# Patient Record
Sex: Male | Born: 1940 | Race: White | Hispanic: No | Marital: Married | Smoking: Never smoker
Health system: Southern US, Community
[De-identification: ages and names within clinical notes are randomized; demographics above are authoritative.]

## PROBLEM LIST (undated history)

## (undated) DIAGNOSIS — E119 Type 2 diabetes mellitus without complications: Secondary | ICD-10-CM

## (undated) DIAGNOSIS — I10 Essential (primary) hypertension: Secondary | ICD-10-CM

## (undated) DIAGNOSIS — N401 Enlarged prostate with lower urinary tract symptoms: Secondary | ICD-10-CM

## (undated) DIAGNOSIS — N390 Urinary tract infection, site not specified: Secondary | ICD-10-CM

## (undated) DIAGNOSIS — M1990 Unspecified osteoarthritis, unspecified site: Secondary | ICD-10-CM

## (undated) DIAGNOSIS — K449 Diaphragmatic hernia without obstruction or gangrene: Secondary | ICD-10-CM

## (undated) DIAGNOSIS — R002 Palpitations: Secondary | ICD-10-CM

## (undated) DIAGNOSIS — K219 Gastro-esophageal reflux disease without esophagitis: Secondary | ICD-10-CM

## (undated) DIAGNOSIS — K5730 Diverticulosis of large intestine without perforation or abscess without bleeding: Secondary | ICD-10-CM

## (undated) DIAGNOSIS — N189 Chronic kidney disease, unspecified: Secondary | ICD-10-CM

## (undated) DIAGNOSIS — I2510 Atherosclerotic heart disease of native coronary artery without angina pectoris: Secondary | ICD-10-CM

## (undated) DIAGNOSIS — Z974 Presence of external hearing-aid: Secondary | ICD-10-CM

## (undated) HISTORY — PX: OTHER SURGICAL HISTORY: SHX169

## (undated) HISTORY — PX: HEMICOLECTOMY: SHX854

## (undated) HISTORY — PX: CATARACT EXTRACTION W/ INTRAOCULAR LENS  IMPLANT, BILATERAL: SHX1307

## (undated) HISTORY — PX: TONSILLECTOMY: SUR1361

## (undated) HISTORY — PX: TOTAL KNEE ARTHROPLASTY: SHX125

## (undated) MED ORDER — LOSARTAN-HYDROCHLOROTHIAZIDE 50 MG-12.5 MG TAB
ORAL_TABLET | ORAL | Status: DC
Start: ? — End: 2013-10-20

## (undated) MED ORDER — ONETOUCH ULTRASOFT LANCETS
PACK | Status: DC
Start: ? — End: 2012-03-26

## (undated) MED ORDER — ONETOUCH ULTRASOFT LANCETS
PACK | Status: DC
Start: ? — End: 2013-06-14

## (undated) MED ORDER — ONETOUCH ULTRA TEST STRIPS
ORAL_STRIP | Status: DC
Start: ? — End: 2012-03-26

## (undated) MED ORDER — SIMVASTATIN 20 MG TAB
20 mg | ORAL_TABLET | ORAL | Status: DC
Start: ? — End: 2014-02-03

## (undated) MED ORDER — ONETOUCH ULTRA TEST STRIPS
ORAL_STRIP | Status: DC
Start: ? — End: 2013-07-22

---

## 2004-09-30 ENCOUNTER — Ambulatory Visit: Payer: Self-pay

## 2008-10-10 MED ADMIN — fentanyl citrate (pf) injection 100 mcg: INTRAVENOUS | @ 15:00:00 | NDC 00409909332

## 2008-10-10 MED ADMIN — midazolam (VERSED) injection 1-10 mg: INTRAVENOUS | @ 15:00:00 | NDC 10019002837

## 2008-10-10 MED FILL — MIDAZOLAM 1 MG/ML IJ SOLN: 1 mg/mL | INTRAMUSCULAR | Qty: 10

## 2008-10-10 MED FILL — DEXTROSE 5% IN NORMAL SALINE IV: INTRAVENOUS | Qty: 1000

## 2008-10-10 MED FILL — FENTANYL CITRATE (PF) 50 MCG/ML IJ SOLN: 50 mcg/mL | INTRAMUSCULAR | Qty: 2

## 2008-10-10 NOTE — Procedures (Signed)
Colonoscopy Operative Report    Procedure Type:   Colonoscopy --diagnostic     Indications:     Screening Colonoscopy - V76.51    Pre-operative Diagnosis: see indication above    Post-operative Diagnosis:  See findings below    Operator: Thad Ranger, MD    Referring Provider: [facility]    Sedation:  Versed 3 mg IV and Fentanyl 100 mcg IV    Pre-Procedural Exam:      Airway: clear, Malimpati 2   Heart: RRR, without gallops or rubs  Lungs: clear bilaterally without wheezes, crackles, or rhonchi  Abdomen: soft, nontender, nondistended, bowel sounds present     Procedure Details:  After informed consent was obtained with all risks and benefits of procedure explained and preoperative exam completed, the patient was taken to the endoscopy suite and placed in the left lateral decubitus position.  Upon sequential sedation as per above, a digital rectal exam was performed.  The Olympus videocolonoscope CF140L was inserted in the rectum and carefully advanced to the cecum, which was identified by the ileocecal valve and appendiceal orifice.    The quality of preparation was excellent.  The colonoscope was slowly withdrawn with careful evaluation between folds. Retoflexion in the rectum was completed.     Findings/Impression: ANUS: Anal exam reveals no masses or hemorrhoids, sphincter tone is normal.   RECTUM: Rectal exam reveals no masses or hemorrhoids.   SIGMOID COLON: The mucosa is normal with good vascular pattern and without ulcers, diverticula, and polyps.   DESCENDING COLON: The mucosa is normal with good vascular pattern and without ulcers, diverticula, and polyps.   SPLENIC FLEXURE: The splenic flexure is normal.   TRANSVERSE COLON: The mucosa is normal with good vascular pattern and without ulcers, diverticula, and polyps.   HEPATIC FLEXURE: The hepatic flexure is normal.   ASCENDING COLON: The mucosa is normal with good vascular pattern and without ulcers, diverticula, and polyps.   CECUM: The appendiceal orifice  appears normal. The ileocecal valve appears normal.   TERMINAL ILEUM: The terminal ileum was not entered.       Specimen Removed:      Complications: None.     EBL:  None.    Recommendations: --For colon cancer screening in this average-risk patient, colonoscopy may be repeated in 10 years. Regular diet.  Resume normal medication(s).       Discharge Disposition:  Home in the company of a driver when able to ambulate.

## 2008-10-10 NOTE — Procedures (Signed)
Colonoscopy Operative Report    Procedure Type:   Colonoscopy --diagnostic     Indications:     Screening Colonoscopy - V76.51    Pre-operative Diagnosis: see indication above    Post-operative Diagnosis:  See findings below    Operator: Thad Ranger, MD    Referring Provider: [facility]    Sedation:  Versed 3 mg IV and Fentanyl 100 mcg IV    Pre-Procedural Exam:      Airway: clear, Malimpati 2   Heart: RRR, without gallops or rubs  Lungs: clear bilaterally without wheezes, crackles, or rhonchi  Abdomen: soft, nontender, nondistended, bowel sounds present     Procedure Details:  After informed consent was obtained with all risks and benefits of procedure explained and preoperative exam completed, the patient was taken to the endoscopy suite and placed in the left lateral decubitus position.  Upon sequential sedation as per above, a digital rectal exam was performed.  The Olympus videocolonoscope CF140L was inserted in the rectum and carefully advanced to the cecum, which was identified by the ileocecal valve and appendiceal orifice.    The quality of preparation was excellent.  The colonoscope was slowly withdrawn with careful evaluation between folds. Retoflexion in the rectum was completed.     Findings/Impression: ANUS: Anal exam reveals no masses or hemorrhoids, sphincter tone is normal.   RECTUM: Rectal exam reveals no masses or hemorrhoids.   SIGMOID COLON: The mucosa is normal with good vascular pattern and without ulcers, diverticula, and polyps.   DESCENDING COLON: The mucosa is normal with good vascular pattern and without ulcers, diverticula, and polyps.   SPLENIC FLEXURE: The splenic flexure is normal.   TRANSVERSE COLON: The mucosa is normal with good vascular pattern and without ulcers, diverticula, and polyps.   HEPATIC FLEXURE: The hepatic flexure is normal.   ASCENDING COLON: The mucosa is normal with good vascular pattern and without ulcers, diverticula, and polyps.    CECUM: The appendiceal orifice appears normal. The ileocecal valve appears normal.   TERMINAL ILEUM: The terminal ileum was not entered.       Specimen Removed:      Complications: None.     EBL:  None.    Recommendations: --For colon cancer screening in this average-risk patient, colonoscopy may be repeated in 10 years. Regular diet.  Resume normal medication(s).       Discharge Disposition:  Home in the company of a driver when able to ambulate.

## 2008-10-10 NOTE — H&P (Signed)
History and Physical    Subjective:screening       Past Medical History   Diagnosis Date   ??? Hypertension    ??? GERD (gastroesophageal reflux disease)    ??? Diabetes      diet controled   ??? Arthritis      knees   ??? Other ill-defined conditions      diverticular disease/colon        Past Surgical History   Procedure Date   ??? Abdomen surgery proc unlisted      colectomy/12 years ago   ??? Hx appendectomy    ??? Hx other surgical      tonsil       No family history on file.   History   Substance Use Topics   ??? Tobacco Use: Never   ??? Alcohol Use: Yes      social         Prior to Admission medications    Medication Sig Start Date End Date Taking? Authorizing Provider   simvastatin (ZOCOR) 20 mg tablet Take 20 mg by mouth nightly.   Yes Historical Provider   losartan (COZAAR) 50 mg tablet Take 50 mg by mouth daily.   Yes Historical Provider   omeprazole (PRILOSEC) 20 mg capsule Take 20 mg by mouth daily.   Yes Historical Provider   naproxen sodium (ALEVE) 220 mg tablet Take 220 mg by mouth two (2) times daily (with meals).   Yes Historical Provider       Allergies   Allergen Reactions   ??? Penicillins Rash          Review of Systems:  Pertinent items are noted in the History of Present Illness.     Objective:     Intake and Output:            Physical Exam:   BP 133/81   Pulse 50   Resp 16   Ht 5\' 11"  (1.803 m)   Wt 243 lb (110.224 kg)   SpO2 95%  General appearance: alert, cooperative, no distress, appears stated age  Lungs: clear to auscultation bilaterally  Heart: regular rate and rhythm, S1, S2 normal, no murmur, click, rub or gallop  Abdomen: soft, non-tender. Bowel sounds normal. No masses,  no organomegaly    Data Review:   No results found for this or any previous visit (from the past 24 hour(s)).    Assessment:     Patient Active Hospital Problem List:   * No active hospital problems. *       Plan:     colon    Signed By: Arnoldo Lenis, MD     October 10, 2008

## 2010-03-15 NOTE — Patient Instructions (Signed)
1) Check blood sugars 4 times per week.  One time should be fasting (goal is 80-130).  The other time should be 2 hours after a meal (goal is less than 180).  Alternate one meal a day to check (example: one day check after breakfast, one day after lunch, one day after dinner).  Write down what you ate if your blood sugar is more than 180 so you can know to cut back or cut out this food from your diet.    2) Please go to your local [facility] 3-4 days before your next appointment to have your labs drawn.  Please fast for your labs.  Don't eat anything after midnight.

## 2010-03-15 NOTE — Progress Notes (Signed)
Chief Complaint   Patient presents with   ??? Diabetes       History of Present Illness: Walter Molina is a 69 y.o. male who is a new patient for evaluation of diabetes.  Was diagnosed with diabetes about 10 years ago and has been controlling this with diet the whole time.  Checks blood sugars about once a month fasting and they are usually in the 125-130 range.  Most recent Hgb A1c was 7.1% in September. A typical day is as follows:  - breakfast: cereal or oatmeal, may have 1/2 of a small bagel with this, 6 oz of juice  - lunch: may have fast food: hamburger without fries, chips if he gets a sub  - dinner: cracker barrel: meat loaf or chicken with carrots and Warr beans, likes rice, not much pasta or potato, occ biscuit, not much dessert unless sugar free  - beverages: rice milk, diet soda,   - snacks: sugar free candy or piece of rice cheese  Exercise consists of walking for 30 minutes most days of the week.  No history of vascular disease.  No history of retinopathy, neuropathy, or nephropathy.  Last eye exam was June 2011.    Past Medical History   Diagnosis Date   ??? Hypertension    ??? GERD (gastroesophageal reflux disease)    ??? Arthritis      knees   ??? Other ill-defined conditions      diverticular disease/colon   ??? Other and unspecified hyperlipidemia    ??? DM w/o complication type II    ??? Seasonal allergic rhinitis        Past Surgical History   Procedure Date   ??? Abdomen surgery proc unlisted      colectomy/12 years ago   ??? Hx appendectomy    ??? Hx other surgical      tonsillectomy   ??? Colonoscopy 10/10/2008         ??? Hx orthopaedic      bunionectomy b/l       Current outpatient prescriptions   Medication Sig   ??? losartan-hydrochlorothiazide (HYZAAR) 50-12.5 mg per tablet Take 1 Tab by mouth daily.   ??? simvastatin (ZOCOR) 20 mg tablet Take 20 mg by mouth nightly.   ??? omeprazole (PRILOSEC) 20 mg capsule Take 20 mg by mouth daily.    ??? naproxen sodium (ALEVE) 220 mg tablet Take 220 mg by mouth two (2) times daily (with meals).       Allergies   Allergen Reactions   ??? Penicillins Rash       Family History   Problem Relation Age of Onset   ??? Diabetes Father    ??? Stroke Father 9   ??? Diabetes Other      cousin   ??? Heart Disease Neg Hx        History   Social History   ??? Marital Status: Married     Spouse Name: N/A     Number of Children: N/A   ??? Years of Education: N/A   Occupational History   ??? Not on file.   Social History Main Topics   ??? Smoking status: Never Smoker    ??? Smokeless tobacco: Not on file   ??? Alcohol Use: Yes      very rarely may have a beer or glass of wine   ??? Drug Use: No   ??? Sexually Active:    Other Topics Concern   ??? Not on file  Social History Narrative    Lives in [place] with wife of 9 years.  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         Review of Systems:  - Constitutional Symptoms: no fevers, chills, weight loss  - Eyes: no blurry vision or double vision  - Cardiovascular: no chest pain or palpitations  - Respiratory: no cough or shortness of breath  - Gastrointestinal: no dysphagia or abdominal pain  - Musculoskeletal: (+) left knee pain and left shoulder pain  - Integumentary: no rashes  - Neurological: no numbness, tingling, occ sinus headaches  - Psychiatric: no depression or anxiety  - Endocrine: no heat or cold intolerance, no polyuria or polydipsia, nocturia x1-2    Physical Examination:  Blood pressure 130/72, pulse 56, height 5\' 11"  (1.803 m), weight 239 lb 5 oz (108.551 kg).  - General: pleasant, no distress, good eye contact  - HEENT: no exopthalmos, no periorbital edema, no scleral/conjunctival injection, EOMI, no lid lag or stare  - Neck: supple, no thyromegaly, masses, lymph nodes, or carotid bruits, no supraclavicular or dorsocervical fat pads   - Cardiovascular: regular, normal rate, normal S1 and S2, no murmurs/rubs/gallops, 2+ dorsalis pedis pulses bilaterally  - Respiratory: clear to auscultation bilaterally  - Gastrointestinal: soft, nontender, nondistended, no masses, no hepatosplenomegaly  - Musculoskeletal: no proximal muscle weakness in upper or lower extremities  - Integumentary: no acanthosis nigricans, no abdominal striae, no rashes, no edema, no foot ulcers  - Neurological: intact sensation to monofilament 4/4 locations, decreased vibratory sensation at great toes bilaterally,   - Psychiatric: normal mood and affect    Data Reviewed: 9/11  - Hgb A1c 7.1%  - lipids: total 149 ,  HDL 35, TG 188, LDL 76  - ALT 44, AST 25  - BUN/Cr 18/0.89    Assessment/Plan:   1. 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  He has been diet controlled for about 10 years and I would like to try and continue this for as long as possible.  his biggest problem is the amount of CHO in his diet and we discussed the need for checking post-prandial blood sugars to determine where to make changes in his diet.  If he does not make progress with dietary changes then he will benefit from starting metformin in the future.  - cont diet control for now.  - check bs 4 times per week  - foot exam done 12/11  - optho UTD 6/11  - check Hgb A1c, cmp, and microalbumin prior to next visit     2. Hypertension (401.9AJ) his BP was at goal < 130/80.  - cont hyzaar 50/12.5 mg daily     3. 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  LDL 79 in September.  - cont simva 20 mg daily  - check lipids prior to next visit          Patient Instructions    1) Check blood sugars 4 times per week.  One time should be fasting (goal is 80-130).  The other time should be 2 hours after a meal (goal is less than 180).  Alternate one meal a day to check (example: one day check after breakfast, one day after lunch, one day after dinner).  Write down what you  ate if your blood sugar is more than 180 so you can know to cut back or cut out this food from your diet.    2) Please go to your local [facility] 3-4 days before your next appointment to have your labs drawn.  Please fast for your labs.  Don't eat anything after midnight.              Follow-up Disposition:  Return in about 7 weeks (around 05/03/2010).    Copy sent to:  [facility] [phone]

## 2010-04-07 LAB — URINALYSIS W/MICROSCOPIC
Bacteria: NEGATIVE /HPF
Bilirubin: NEGATIVE
Blood: NEGATIVE
Glucose: NEGATIVE MG/DL
Ketone: NEGATIVE MG/DL
Leukocyte Esterase: NEGATIVE
Nitrites: NEGATIVE
Protein: NEGATIVE MG/DL
Specific gravity: 1.02 (ref 1.003–1.030)
Specific gravity: 1.02 (ref 1.003–1.030)
Urobilinogen: 0.2 EU/DL (ref 0.2–1.0)
pH (UA): 5 (ref 5.0–8.0)

## 2010-04-07 MED ORDER — IBUPROFEN 400 MG TAB
400 mg | ORAL_TABLET | Freq: Four times a day (QID) | ORAL | Status: DC | PRN
Start: 2010-04-07 — End: 2010-05-05

## 2010-04-07 MED ORDER — HYDROCODONE-ACETAMINOPHEN 5 MG-500 MG TAB
5-500 mg | ORAL_TABLET | ORAL | Status: DC | PRN
Start: 2010-04-07 — End: 2010-05-05

## 2010-04-07 NOTE — ED Provider Notes (Signed)
HPI Comments: 69 y.o. WM presents ambulatory in ED with cc of R flank pain x 1 week. Pt reports that his pain is worse at night. Pt denies any fever, chills, n/v/d, or any other sx. He was seen at [facility] 2 days ago for same sx, negative x-rays, and d/c home with no definite dx. He denies any hx of kidney stones. Pt's pmhx is significant for HTN, and DM.     PCP: none  Tobacco: no  EtOH: yes, socially    Pt has no other complaints at this time. Written by Suezanne Cheshire, ED Scribe, as dictated by Darlys Gales, MD.       The history is provided by the patient. No language interpreter was used.        Past Medical History   Diagnosis Date   ??? Hypertension    ??? GERD (gastroesophageal reflux disease)    ??? Arthritis      knees   ??? Other ill-defined conditions      diverticular disease/colon   ??? Other and unspecified hyperlipidemia    ??? DM w/o complication type II    ??? Seasonal allergic rhinitis           Past Surgical History   Procedure Date   ??? Abdomen surgery proc unlisted      colectomy/12 years ago   ??? Hx appendectomy    ??? Hx other surgical      tonsillectomy   ??? Colonoscopy 10/10/2008         ??? Hx orthopaedic      bunionectomy b/l           Family History   Problem Relation Age of Onset   ??? Diabetes Father    ??? Stroke Father 59   ??? Diabetes Other      cousin   ??? Heart Disease Neg Hx           History   Social History   ??? Marital Status: Married     Spouse Name: N/A     Number of Children: N/A   ??? Years of Education: N/A   Occupational History   ??? Not on file.   Social History Main Topics   ??? Smoking status: Never Smoker    ??? Smokeless tobacco: Not on file   ??? Alcohol Use: Yes      very rarely may have a beer or glass of wine   ??? Drug Use: No   ??? Sexually Active:    Other Topics Concern   ??? Not on file   Social History Narrative     Lives in [place] with wife of 9 years.  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                      ALLERGIES: Penicillins      Review of Systems   Constitutional: Negative.  Negative for fever, chills, activity change, appetite change, fatigue and unexpected weight change.   HENT: Negative.  Negative for hearing loss, congestion, rhinorrhea, sneezing, neck stiffness and voice change.    Eyes: Negative.  Negative for pain and visual disturbance.   Respiratory: Negative.  Negative for apnea, cough, choking, chest tightness and shortness of breath.    Cardiovascular: Negative.  Negative for chest pain and palpitations.   Gastrointestinal: Negative.  Negative for nausea, vomiting, abdominal  pain, diarrhea, blood in stool and abdominal distention.   Genitourinary: Positive for flank pain. Negative for urgency, frequency and difficulty urinating.        No discharge   Musculoskeletal: Negative.  Negative for myalgias, back pain and arthralgias.   Skin: Negative.  Negative for color change and rash.   Neurological: Negative.  Negative for dizziness, seizures, syncope, speech difficulty, weakness, numbness and headaches.   Hematological: Negative.  Negative for adenopathy.   Psychiatric/Behavioral: Negative.  Negative for suicidal ideas, behavioral problems, dysphoric mood and agitation. The patient is not nervous/anxious.    All other systems reviewed and are negative.        Filed Vitals:    04/07/10 0830 04/07/10 0911   BP: 151/82    Pulse: 53    Temp: 97.4 ??F (36.3 ??C)    Resp: 18    Height: 5\' 11"  (1.803 m)    Weight: 241 lb 4.8 oz (109.453 kg)    SpO2: 97% 96%              Physical Exam   Nursing note and vitals reviewed.  Constitutional: Vital signs are normal. He appears well-developed and well-nourished. He is active.  Non-toxic appearance. He does not appear ill. No distress.   HENT:    Head: Normocephalic and atraumatic.   Neck: Normal range of motion. Neck supple. Carotid bruit is not present. No tracheal deviation present. No thyromegaly present.   Cardiovascular: Normal rate, regular rhythm and normal heart sounds.  Exam reveals no gallop and no friction rub.    No murmur heard.  Pulmonary/Chest: Effort normal and breath sounds normal. No stridor. No respiratory distress. He has no wheezes. He has no rales. He exhibits no tenderness.   Abdominal: Soft. He exhibits no distension and no mass. No tenderness. He has no rebound, no guarding and no CVA tenderness.   Musculoskeletal: Normal range of motion.   Neurological: He is alert.   Skin: Skin is warm, dry and intact. He is not diaphoretic. No pallor.   Psychiatric: He has a normal mood and affect. His speech is normal and behavior is normal. Judgment and thought content normal.   Written by Suezanne Cheshire, ED Scribe, as dictated by Darlys Gales, MD.      MDM    Procedures    10:05 AM  Pt reports colonoscopy 1 year ago. Pt has no other complaints at this time.   Written by Suezanne Cheshire, ED Scribe, as dictated by Darlys Gales, MD.    10:07 AM   Tacy Learn  results have been reviewed with him.  He has been counseled regarding his diagnosis.  He verbally conveys understanding and agreement of the signs, symptoms, diagnosis, treatment and prognosis and additionally agrees to follow up as recommended with Dr. Park Breed, GI. He received rx for vicodin and motrin. He also agrees with the care-plan and conveys that all of his questions have been answered.  I have also put together some discharge instructions for him that include: 1) educational information regarding their diagnosis, 2) how to care for their diagnosis at home, as well a 3) list of reasons why they would want to return to the ED prior to their follow-up appointment, should their condition change.    Written by Suezanne Cheshire, ED Scribe, as dictated by Darlys Gales, MD.

## 2010-04-07 NOTE — ED Notes (Signed)
Patient alert and orientated to person place and time. Color pink. Skin warm and dry. Respirations even and unlabored. Patient dc ambulatory in stable condition with wife.

## 2010-05-02 LAB — METABOLIC PANEL, COMPREHENSIVE
A-G Ratio: 2 (ref 1.1–2.5)
ALT (SGPT): 35 IU/L (ref 0–55)
AST (SGOT): 24 IU/L (ref 0–40)
Albumin: 4.4 g/dL (ref 3.5–4.8)
Alk. phosphatase: 65 IU/L (ref 25–160)
BUN/Creatinine ratio: 19 (ref 10–22)
BUN: 18 mg/dL (ref 8–27)
Bilirubin, total: 0.6 mg/dL (ref 0.0–1.2)
CO2: 22 mmol/L (ref 20–32)
Calcium: 9.8 mg/dL (ref 8.6–10.2)
Chloride: 102 mmol/L (ref 97–108)
Creatinine: 0.94 mg/dL (ref 0.76–1.27)
GFR est AA: 94 mL/min/{1.73_m2} (ref >59)
GFR est non-AA: 81 mL/min/{1.73_m2} (ref >59)
GLOBULIN, TOTAL: 2.2 g/dL (ref 1.5–4.5)
Glucose: 135 mg/dL — ABNORMAL HIGH (ref 65–99)
Potassium: 4.8 mmol/L (ref 3.5–5.2)
Protein, total: 6.6 g/dL (ref 6.0–8.5)
Sodium: 143 mmol/L (ref 134–144)

## 2010-05-02 LAB — HEMOGLOBIN A1C WITH EAG: Hemoglobin A1c: 6.1 % — ABNORMAL HIGH (ref 4.8–5.6)

## 2010-05-02 LAB — LIPID PANEL
Cholesterol, total: 154 mg/dL (ref 100–199)
HDL Cholesterol: 36 mg/dL — ABNORMAL LOW (ref >39)
LDL, calculated: 86 mg/dL (ref 0–99)
Triglyceride: 160 mg/dL — ABNORMAL HIGH (ref 0–149)
VLDL, calculated: 32 mg/dL (ref 5–40)

## 2010-05-02 LAB — MICROALBUMIN, UR, RAND W/ MICROALB/CREAT RATIO
Creatinine, urine random: 79.5 mg/dL (ref 22.0–328.0)
Microalb/Creat ratio (ug/mg creat.): 2 mg/g creat (ref 0.0–30.0)
Microalbumin, urine: 1.6 ug/mL (ref 0.0–17.0)

## 2010-05-05 NOTE — Patient Instructions (Addendum)
1) Your Hemoglobin A1c (3 month test of blood sugar) is excellent at 6.1%.  Keep up the good work.    2) Your LDL (bad cholesterol) is at goal under 100.    3) Your blood pressure is at goal under 130/80.    4) You won't need to fast for your labs before your next visit.

## 2010-05-05 NOTE — Progress Notes (Addendum)
Chief Complaint   Patient presents with   ??? Diabetes       History of Present Illness: Walter Molina is a 70 y.o. male here for follow up of diabetes.  Has still been walking everyday and his weight is down 2 more lbs since last visit.  All of his 2 hour post meals readings are under 140 aside from a rare reading under 180 with chinese food. Overall is feeling quite good.    Current outpatient prescriptions   Medication Sig   ??? losartan-hydrochlorothiazide (HYZAAR) 50-12.5 mg per tablet Take 1 Tab by mouth daily.   ??? simvastatin (ZOCOR) 20 mg tablet Take 20 mg by mouth nightly.   ??? omeprazole (PRILOSEC) 20 mg capsule Take 20 mg by mouth daily.   ??? naproxen sodium (ALEVE) 220 mg tablet Take 220 mg by mouth as needed.       Allergies   Allergen Reactions   ??? Penicillins Rash       Review of Systems:  - Eyes: no blurry vision or double vision  - Cardiovascular: no chest pain  - Respiratory: no shortness of breath  - Musculoskeletal: no myalgias  - Neurological: no numbness/tingling in extremities    Physical Examination:  Blood pressure 128/68, pulse 52, height 5\' 11"  (1.803 m), weight 237 lb 8 oz (107.729 kg).  - General: pleasant, no distress, good eye contact   - Neck: no carotid bruits  - Cardiovascular: regular, normal rate, nl s1 and s2, no m/r/g,    - Respiratory: clear bilaterally  - Integumentary: no edema,   - Psychiatric: normal mood and affect    Data Reviewed:   Component      Latest Ref Rng 04/30/2010 04/30/2010 04/30/2010 04/30/2010           4:47 PM  4:47 PM  4:47 PM  4:47 PM   Glucose      65 - 99 mg/dL  657 (H)     BUN      8 - 27 mg/dL  18     Creatinine      0.76 - 1.27 mg/dL  8.46     GFR est non-AA      >59 mL/min/1.73  81     GFR est AA      >59 mL/min/1.73  94     BUN/Creatinine ratio      10 - 22  19     Sodium      134 - 144 mmol/L  143     Potassium      3.5 - 5.2 mmol/L  4.8     Chloride      97 - 108 mmol/L  102     CO2      20 - 32 mmol/L  22     Calcium      8.6 - 10.2 mg/dL  9.8      Protein, total      6.0 - 8.5 g/dL  6.6     Albumin      3.5 - 4.8 g/dL  4.4     GLOBULIN, TOTAL      1.5 - 4.5 g/dL  2.2     A-G Ratio      1.1 - 2.5  2.0     Bilirubin, total      0.0 - 1.2 mg/dL  0.6     Alk. phosphatase      25 - 160 IU/L  65     AST  0 - 40 IU/L  24     ALT      0 - 55 IU/L  35     Cholesterol, total      100 - 199 mg/dL    161   Triglyceride      0 - 149 mg/dL    096 (H)   HDL Cholesterol      >39 mg/dL    36 (L)   VLDL, calculated      5 - 40 mg/dL    32   LDL, calculated      0 - 99 mg/dL    86   Creatinine,urine random      22.0 - 328.0 mg/dL   04.5    Microalbumin urine,random      0.0 - 17.0 ug/mL   1.6    Microalb/Creat ratio (ug/mg creat.)      0.0 - 30.0 mg/g creat   2.0    Hemoglobin A1C      4.8 - 5.6 % 6.1 (H)          Assessment/Plan:     1. 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  He has been diet controlled for about 10 years and I would like to try and continue this for as long as possible.  Has done well with identifying his post-meal spikes and watching his intake of these foods.  - cont diet control for now.  - check bs 4 times per week  - foot exam done 12/11  - optho UTD 6/11  - microalbumin nl 1/12  - check Hgb A1c and cmp prior to next visit     2. Hypertension (401.9AJ) his BP was at goal < 130/80.  - cont hyzaar 50/12.5 mg daily     3. 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  LDL 79 in September and 86 in January 2012  - cont simva 20 mg daily  - check lipids prior to next visit                Patient Instructions   1) Your Hemoglobin A1c (3 month test of blood sugar) is excellent at 6.1%.  Keep up the good work.    2) Your LDL (bad cholesterol) is at goal under 100.    3) Your blood pressure is at goal under 130/80.    4) You won't need to fast for your labs before your next visit.        Follow-up Disposition:   Return in about 3 months (around 08/04/2010) for non-fasting labs.    Copy sent to:  [facility] [phone]    Addendum: 08/18/10    Sent him the following message in a letter:    It appears you missed your follow up appointment on 08/06/10 and have not yet rescheduled so I wanted to forward you a copy of your results.  I would like to see you back in the next 4-6 months so please reschedule your appointment with a fasting lab a few days beforehand.      METABOLIC PANEL, BASIC       Component Value Range    Glucose 137 (*) 65 - 99 (mg/dL)    BUN 18  8 - 27 (mg/dL)    Creatinine 1.47  8.29 - 1.27 (mg/dL)    GFR est non-AA  86  >59 (mL/min/1.73)    GFR est AA 100  >59 (mL/min/1.73)    BUN/Creatinine ratio 20  10 - 22     Sodium 140  134 - 144 (mmol/L)    Potassium 4.8  3.5 - 5.2 (mmol/L)    Chloride 102  97 - 108 (mmol/L)    CO2 25  20 - 32 (mmol/L)    Calcium 9.6  8.6 - 10.2 (mg/dL)   HEMOGLOBIN Z6X       Component Value Range    Hemoglobin A1C 6.4 (*) 4.8 - 5.6 (%)       Hemoglobin A1c is a 3 month marker of your diabetes control.  Goal is less than 7% which means your average blood sugar is less than 150. Your Hemoglobin A1c is 6.4% which means your diabetes is under excellent control.  Continue to work on your diet and exercise     BUN and creatinine are markers of kidney function.  Your values are 18 and 0.88 which are normal.

## 2010-08-03 LAB — HEMOGLOBIN A1C WITH EAG: Hemoglobin A1c: 6.4 % — ABNORMAL HIGH (ref 4.8–5.6)

## 2010-08-03 LAB — METABOLIC PANEL, BASIC
BUN/Creatinine ratio: 20 (ref 10–22)
BUN: 18 mg/dL (ref 8–27)
CO2: 25 mmol/L (ref 20–32)
Calcium: 9.6 mg/dL (ref 8.6–10.2)
Chloride: 102 mmol/L (ref 97–108)
Creatinine: 0.88 mg/dL (ref 0.76–1.27)
GFR est AA: 100 mL/min/{1.73_m2} (ref >59)
GFR est non-AA: 86 mL/min/{1.73_m2} (ref >59)
Glucose: 137 mg/dL — ABNORMAL HIGH (ref 65–99)
Potassium: 4.8 mmol/L (ref 3.5–5.2)
Sodium: 140 mmol/L (ref 134–144)

## 2010-08-06 NOTE — Progress Notes (Signed)
A user error has taken place: encounter opened in error, closed for administrative reasons.

## 2010-08-30 NOTE — Patient Instructions (Signed)
1) Your diabetes is still very well controlled on diet alone.  Keep up the good work.    2) Your blood pressure is excellent.    3) Please fast for your labs.  Don't eat anything after midnight.

## 2010-08-30 NOTE — Progress Notes (Signed)
Chief Complaint   Patient presents with   ??? Diabetes     History of Present Illness: Walter Molina is a 70 y.o. male here for follow up of diabetes.  Fasting sugars are in the 130s and up to 150 after meals.  Walking 30 minutes a day most days a week.  Has lost another 1 lb since last visit.    Current Outpatient Prescriptions   Medication Sig   ??? losartan-hydrochlorothiazide (HYZAAR) 50-12.5 mg per tablet Take 1 Tab by mouth daily.   ??? simvastatin (ZOCOR) 20 mg tablet Take 20 mg by mouth nightly.   ??? omeprazole (PRILOSEC) 20 mg capsule Take 20 mg by mouth daily.   ??? naproxen sodium (ALEVE) 220 mg tablet Take 220 mg by mouth as needed.     Allergies   Allergen Reactions   ??? Penicillins Rash     Review of Systems:  - Eyes: no blurry vision or double vision  - Cardiovascular: no chest pain  - Respiratory: no shortness of breath  - Musculoskeletal: no myalgias  - Neurological: no numbness/tingling in extremities    Physical Examination:  - Blood pressure 120/55, pulse 48, height 5\' 11"  (1.803 m), weight 236 lb 11.2 oz (107.366 kg).  - General: pleasant, no distress, good eye contact   - Neck: no carotid bruits  - Cardiovascular: regular, normal rate, nl s1 and s2, no m/r/g,    - Respiratory: clear bilaterally  - Integumentary: no edema,   - Psychiatric: normal mood and affect    Data Reviewed:   Component      Latest Ref Rng 08/02/2010 08/02/2010           5:34 PM  5:34 PM   Glucose      65 - 99 mg/dL  161 (H)   BUN      8 - 27 mg/dL  18   Creatinine      0.96 - 1.27 mg/dL  0.45   GFR est non-AA      >59 mL/min/1.73  86   GFR est AA      >59 mL/min/1.73  100   BUN/Creatinine ratio      10 - 22  20   Sodium      134 - 144 mmol/L  140   Potassium      3.5 - 5.2 mmol/L  4.8   Chloride      97 - 108 mmol/L  102   CO2      20 - 32 mmol/L  25   Calcium      8.6 - 10.2 mg/dL  9.6   Hemoglobin W0J      4.8 - 5.6 % 6.4 (H)        Assessment/Plan:      1. 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  He has been diet controlled for about 10 years and I would like to try and continue this for as long as possible.  Has done well with identifying his post-meal spikes and watching his intake of these foods.  - cont diet control for now.  - check bs 4 times per week  - foot exam done 12/11  - optho UTD 6/11  - microalbumin nl 1/12  - check Hgb A1c and cmp prior to next visit     2. Hypertension (401.9AJ) his BP was at goal < 130/80.  -  cont hyzaar 50/12.5 mg daily     3. 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  LDL 79 in September and 86 in January 2012  - cont simva 20 mg daily  - check lipids prior to next visit            Patient Instructions   1) Your diabetes is still very well controlled on diet alone.  Keep up the good work.    2) Your blood pressure is excellent.    3) Please fast for your labs.  Don't eat anything after midnight.        Follow-up Disposition:  Return in about 4 months (around 12/31/2010) for fasting labs.    Copy sent to:  [facility] [phone]

## 2010-09-05 NOTE — ED Provider Notes (Signed)
HPI Comments: This is a 69yo WM that presents ambulatory to ED reporting that he accidentally stabbed himself in the L forearm with metal shop scissors one hour ago (4/10).  Pt denies any other injuries and states that his last tetanus shot was three years ago. He denies any FC, NVD, abdominal pain. Patient does not take any blood thinners or ASA.     Allergy: PCN  Hx: HTN, DM, diverticulosis.   PCP: [facility]  There are no other complaints, changes or physical findings at this time.   Written by Loyal Jacobson, ED Scribe, as dictated by Delena Bali, MD    The history is provided by the patient.        Past Medical History   Diagnosis Date   ??? Hypertension    ??? GERD (gastroesophageal reflux disease)    ??? Arthritis      knees   ??? Other ill-defined conditions      diverticular disease/colon   ??? Other and unspecified hyperlipidemia    ??? Type II or unspecified type diabetes mellitus without mention of complication, not stated as uncontrolled    ??? Seasonal allergic rhinitis         Past Surgical History   Procedure Date   ??? Abdomen surgery proc unlisted      colectomy/12 years ago   ??? Hx appendectomy    ??? Hx other surgical      tonsillectomy   ??? Colonoscopy 10/10/2008         ??? Hx orthopaedic      bunionectomy b/l         Family History   Problem Relation Age of Onset   ??? Diabetes Father    ??? Stroke Father 32   ??? Diabetes Other      cousin   ??? Heart Disease Neg Hx         History     Social History   ??? Marital Status: Married     Spouse Name: N/A     Number of Children: N/A   ??? Years of Education: N/A     Occupational History   ??? Not on file.     Social History Main Topics   ??? Smoking status: Never Smoker    ??? Smokeless tobacco: Not on file   ??? Alcohol Use: Yes      very rarely may have a beer or glass of wine   ??? Drug Use: No   ??? Sexually Active: Not on file     Other Topics Concern   ??? Not on file     Social History Narrative     Lives in [place] with wife of 9 years.  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              ALLERGIES: Penicillins      Review of Systems   Constitutional: Negative.  Negative for fever and chills.   HENT: Negative.  Negative for sore throat.    Eyes: Negative.    Respiratory: Negative.  Negative for shortness of breath.    Cardiovascular: Negative.  Negative for chest pain.   Gastrointestinal: Negative.  Negative for nausea, vomiting, abdominal pain and diarrhea.   Genitourinary: Negative.  Negative for dysuria.   Musculoskeletal: Negative.  Negative for back pain.   Skin: Positive for wound.        See  HPI   Neurological: Negative.  Negative for light-headedness and headaches.   All other systems reviewed and are negative.        Filed Vitals:    09/05/10 1944   BP: 150/74   Pulse: 56   Temp: 98.2 ??F (36.8 ??C)   Resp: 16   Height: 5\' 11"  (1.803 m)   Weight: 107.1 kg (236 lb 1.8 oz)   SpO2: 95%            Physical Exam   Patient in NAD, calm and pleasant.   Skin: Shallow and linear 5mm puncture wound to the palmar aspect of the mid L forearm. Minimal venous oozing. 2+ radial and ulnar pulses. Brisk CR and motor and sensory grossly intact.    Written by Loyal Jacobson, ED Scribe, as dictated by Delena Bali, MD    MDM     Amount and/or Complexity of Data Reviewed:    Review and summarize past medical records:  Yes  Progress:   Patient progress:  Stable      Procedures    Procedure Note - Wound dressing:  8:17 PM  Procedure by Delena Bali, MD  Complexity: Simple  Wound cleansed with chlorhexidine. Applied multiple steri strips to wound with good hemostasis.  Due to puncture wound, I do not feel sutures are indicated. Procedure took 1-15 minutes. Patient tolerated procedure well.   Written by Loyal Jacobson, ED Scribe, as dictated by Delena Bali, MD      Lab Results:     No results found for this or any previous visit (from the past 12 hour(s)).    DIscharge Rx:    New prescriptions   Medication Sig Dispense Refill   ??? losartan-hydrochlorothiazide (HYZAAR) 50-12.5 mg per tablet Take 1 Tab by mouth daily.             Diagnosis:  1. Puncture wound of left forearm            Plan/DIscharge note:      8:57 PM  Tacy Learn  results have been reviewed with him.  He has been counseled regarding his diagnosis.  He verbally conveys understanding and agreement of the signs, symptoms, diagnosis, treatment and prognosis and additionally agrees to follow up as recommended with Dr. Not On File in 24 - 48 hours.  He also agrees with the care-plan and conveys that all of his questions have been answered.  I have also put together some discharge instructions for him that include: 1) educational information regarding their diagnosis, 2) how to care for their diagnosis at home, as well a 3) list of reasons why they would want to return to the ED prior to their follow-up appointment, should their condition change.   Pt is ready to go home.   Durward Mallard, MD

## 2010-09-05 NOTE — ED Notes (Signed)
Discharge instructions reviewed with pt by Dr. Hetty Ely. pt able to return/verbalize discharge instructions. Copy of discharge instructions given.  Patient condition stable, respiratory status within normal limits, neuro status intact. Pt ambulatory out of er, accompanied by wife

## 2010-09-05 NOTE — ED Notes (Signed)
Wound cleaned with chlorahexadine and steri-strips placed by Dr. Hetty Ely

## 2011-01-01 LAB — METABOLIC PANEL, COMPREHENSIVE
A-G Ratio: 2.2 (ref 1.1–2.5)
ALT (SGPT): 38 IU/L (ref 0–55)
AST (SGOT): 25 IU/L (ref 0–40)
Albumin: 4.3 g/dL (ref 3.5–4.8)
Alk. phosphatase: 68 IU/L (ref 25–160)
BUN/Creatinine ratio: 20 (ref 10–22)
BUN: 19 mg/dL (ref 8–27)
Bilirubin, total: 0.5 mg/dL (ref 0.0–1.2)
CO2: 24 mmol/L (ref 20–32)
Calcium: 9.3 mg/dL (ref 8.6–10.2)
Chloride: 102 mmol/L (ref 97–108)
Creatinine: 0.93 mg/dL (ref 0.76–1.27)
GFR est non-AA: 82 mL/min/{1.73_m2} (ref >59)
GLOBULIN, TOTAL: 2 g/dL (ref 1.5–4.5)
Glucose: 118 mg/dL — ABNORMAL HIGH (ref 65–99)
Potassium: 4.6 mmol/L (ref 3.5–5.2)
Protein, total: 6.3 g/dL (ref 6.0–8.5)
Sodium: 141 mmol/L (ref 134–144)
eGFR If African American: 95 mL/min/{1.73_m2} (ref >59)

## 2011-01-01 LAB — LIPID PANEL
Cholesterol, total: 146 mg/dL (ref 100–199)
HDL Cholesterol: 33 mg/dL — ABNORMAL LOW (ref >39)
LDL, calculated: 83 mg/dL (ref 0–99)
Triglyceride: 150 mg/dL — ABNORMAL HIGH (ref 0–149)
VLDL, calculated: 30 mg/dL (ref 5–40)

## 2011-01-01 LAB — HEMOGLOBIN A1C WITH EAG: Hemoglobin A1c: 6.1 % — ABNORMAL HIGH (ref 4.8–5.6)

## 2011-01-03 NOTE — Patient Instructions (Signed)
1) Your Hemoglobin A1c (3 month test of blood sugar) is still excellent at 6.1% down from 6.4% in April.  Please keep on file until pt calls to pick this up    2) Your blood pressure and cholesterol are still at goal.    3) You won't need to fast for your labs before your next visit.

## 2011-01-03 NOTE — Progress Notes (Signed)
Chief Complaint   Patient presents with   ??? Diabetes     History of Present Illness: Walter Molina is a 70 y.o. male here for follow up of diabetes.  Has been battling elbow inflammation and is on diclofenac for this.  Fasting sugars are in the 130-140s and doesn't tend to check at other times of day.  Saw one reading of 180 but no other readings that high.  Has continued to travel a lot.    Current Outpatient Prescriptions   Medication Sig   ??? diclofenac EC (VOLTAREN) 75 mg EC tablet Take  by mouth two (2) times a day.     ??? losartan-hydrochlorothiazide (HYZAAR) 50-12.5 mg per tablet Take 1 Tab by mouth daily.   ??? simvastatin (ZOCOR) 20 mg tablet Take 20 mg by mouth nightly.   ??? omeprazole (PRILOSEC) 20 mg capsule Take 20 mg by mouth daily.   ??? naproxen sodium (ALEVE) 220 mg tablet Take 220 mg by mouth as needed.     Allergies   Allergen Reactions   ??? Penicillins Rash     Review of Systems:  - Eyes: no blurry vision or double vision  - Cardiovascular: no chest pain  - Respiratory: no shortness of breath  - Musculoskeletal: no myalgias  - Neurological: no numbness/tingling in extremities    Physical Examination:  Blood pressure 124/72, pulse 56, height 5\' 11"  (1.803 m), weight 234 lb 12.8 oz (106.505 kg).  - General: pleasant, no distress, good eye contact   - Neck: no carotid bruits  - Cardiovascular: regular, normal rate, nl s1 and s2, no m/r/g,    - Respiratory: clear bilaterally  - Integumentary: no edema,   - Psychiatric: normal mood and affect    Data Reviewed:   Component      Latest Ref Rng 12/31/2010 12/31/2010 12/31/2010           3:58 PM  3:58 PM  3:58 PM   Glucose      65 - 99 mg/dL  191 (H)    BUN      8 - 27 mg/dL  19    Creatinine      0.76 - 1.27 mg/dL  4.78    GFR est non-AA      >59 mL/min/1.73  82    eGFR If Africn Am      >59 mL/min/1.73  95    BUN/Creatinine ratio      10 - 22  20    Sodium      134 - 144 mmol/L  141    Potassium      3.5 - 5.2 mmol/L  4.6    Chloride      97 - 108 mmol/L  102     CO2      20 - 32 mmol/L  24    Calcium      8.6 - 10.2 mg/dL  9.3    Protein, total      6.0 - 8.5 g/dL  6.3    Albumin      3.5 - 4.8 g/dL  4.3    GLOBULIN, TOTAL      1.5 - 4.5 g/dL  2.0    A-G Ratio      1.1 - 2.5  2.2    Bilirubin, total      0.0 - 1.2 mg/dL  0.5    Alk. phosphatase      25 - 160 IU/L  68    AST  0 - 40 IU/L  25    ALT      0 - 55 IU/L  38    Cholesterol, total      100 - 199 mg/dL   132   Triglyceride      0 - 149 mg/dL   440 (H)   HDL Cholesterol      >39 mg/dL   33 (L)   VLDL, calculated      5 - 40 mg/dL   30   LDL, calculated      0 - 99 mg/dL   83   Hemoglobin N0U      4.8 - 5.6 % 6.1 (H)         Assessment/Plan:     1. 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  He has been diet controlled for about 10 years and I would like to try and continue this for as long as possible.  Has done well with identifying his post-meal spikes and watching his intake of these foods.  - cont diet control for now.  - check bs 4 times per week  - foot exam done 12/11  - optho UTD 7/12  - microalbumin nl 1/12  - check Hgb A1c and bmp and microalbumin prior to next visit     2. Hypertension (401.9AJ) his BP was at goal < 130/80.  - cont hyzaar 50/12.5 mg daily     3. 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  LDL 79 in September 2011 and 86 in January 2012 and 83 in 9/12  - cont simva 20 mg daily  - check lipids in 8 months            Patient Instructions   1) Your Hemoglobin A1c (3 month test of blood sugar) is still excellent at 6.1% down from 6.4% in April.  Please keep on file until pt calls to pick this up    2) Your blood pressure and cholesterol are still at goal.    3) You won't need to fast for your labs before your next visit.            Follow-up Disposition:   Return in about 4 months (around 05/05/2011) for non-fasting labs.    Copy sent to:  [facility] [phone]

## 2011-04-30 LAB — METABOLIC PANEL, BASIC
BUN/Creatinine ratio: 24 — ABNORMAL HIGH (ref 10–22)
BUN: 21 mg/dL (ref 8–27)
CO2: 24 mmol/L (ref 20–32)
Calcium: 9.4 mg/dL (ref 8.6–10.2)
Chloride: 103 mmol/L (ref 97–108)
Creatinine: 0.87 mg/dL (ref 0.76–1.27)
GFR est non-AA: 86 mL/min/{1.73_m2} (ref >59)
Glucose: 223 mg/dL — ABNORMAL HIGH (ref 65–99)
Potassium: 4.5 mmol/L (ref 3.5–5.2)
Sodium: 141 mmol/L (ref 134–144)
eGFR If African American: 100 mL/min/{1.73_m2} (ref >59)

## 2011-04-30 LAB — MICROALBUMIN, UR, RAND W/ MICROALB/CREAT RATIO
Creatinine, urine random: 58.6 mg/dL (ref 22.0–328.0)
Microalb/Creat ratio (ug/mg creat.): 4.1 mg/g creat (ref 0.0–30.0)
Microalbumin, urine: 2.4 ug/mL (ref 0.0–17.0)

## 2011-04-30 LAB — HEMOGLOBIN A1C WITH EAG: Hemoglobin A1c: 6.4 % — ABNORMAL HIGH (ref 4.8–5.6)

## 2011-05-02 NOTE — Patient Instructions (Signed)
1) Your Hemoglobin A1c (3 month test of blood sugar) is up slightly from 6.1% to 6.4% but is still at goal under 7% so we will continue to follow this for now.    2) Your blood pressure is up today but this may be from the steroid effect so we will repeat this at the next visit.    3) Please fast for your labs.  Don't eat anything after midnight.

## 2011-05-02 NOTE — Progress Notes (Signed)
Chief Complaint   Patient presents with   ??? Diabetes     pharmacy confirmed and [facility] is [facility]     History of Present Illness: Walter Molina is a 71 y.o. male here for follow up of diabetes.  He has done well since last visit aside from some left knee pain and just had cortisone shot last Thursday and this hasn't helped too much with the pain so far.  Didn't check his sugar after the shot to see if this caused it to spike but his labs were drawn the day after the shot.  Weight is stable.  Checks sugars about once a week.  Did see a reading up to 168 in the morning during the holidays.  Aside from that his sugars have been under 150 for the most part.    Current Outpatient Prescriptions   Medication Sig   ??? losartan-hydrochlorothiazide (HYZAAR) 50-12.5 mg per tablet Take 1 Tab by mouth daily.   ??? simvastatin (ZOCOR) 20 mg tablet Take 20 mg by mouth nightly.   ??? omeprazole (PRILOSEC) 20 mg capsule Take 20 mg by mouth daily.   ??? naproxen sodium (ALEVE) 220 mg tablet Take 220 mg by mouth as needed.     Allergies   Allergen Reactions   ??? Penicillins Rash     Review of Systems:  - Eyes: no blurry vision or double vision  - Cardiovascular: no chest pain  - Respiratory: no shortness of breath  - Musculoskeletal: no myalgias, (+) left knee pain  - Neurological: no numbness/tingling in extremities    Physical Examination:  - Blood pressure 147/76, pulse 48, height 5\' 11"  (1.803 m), weight 234 lb 4.8 oz (106.278 kg).  - General: pleasant, no distress, good eye contact   - Neck: no carotid bruits  - Cardiovascular: regular, normal rate, nl s1 and s2, no m/r/g, 2+ DP pulses   - Respiratory: clear bilaterally  - Integumentary: no edema, no foot ulcers   - Neurological: decreased sensation to monofilament 4/4 locations, decreased vibratory sensation at great toes bilaterally,   - Psychiatric: normal mood and affect    Data Reviewed:   Component      Latest Ref Rng 04/29/2011 04/29/2011 04/29/2011           5:28 PM  5:28 PM  5:28  PM   Glucose      65 - 99 mg/dL  161 (H)    BUN      8 - 27 mg/dL  21    Creatinine      0.76 - 1.27 mg/dL  0.96    GFR est non-AA      >59 mL/min/1.73  86    eGFR If Africn Am      >59 mL/min/1.73  100    BUN/Creatinine ratio      10 - 22  24 (H)    Sodium      134 - 144 mmol/L  141    Potassium      3.5 - 5.2 mmol/L  4.5    Chloride      97 - 108 mmol/L  103    CO2      20 - 32 mmol/L  24    Calcium      8.6 - 10.2 mg/dL  9.4    Creatinine,urine random      22.0 - 328.0 mg/dL   04.5   Microalbumin, urine      0.0 - 17.0 ug/mL   2.4  Microalb/Creat ratio (ug/mg creat.)      0.0 - 30.0 mg/g creat   4.1   Hemoglobin A1c      4.8 - 5.6 % 6.4 (H)         Assessment/Plan:     1. 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  He has been diet controlled for about 10 years and I would like to try and continue this for as long as possible.  Has done well with identifying his post-meal spikes and watching his intake of these foods.  - cont diet control for now.  - check bs 4 times per week  - foot exam done 1/13  - optho UTD 7/12  - microalbumin nl 1/13  - check Hgb A1c and cmp prior to next visit     2. Hypertension (401.9AJ) his BP was above goal < 130/80 but may have been up due to knee pain and steroid injection so will repeat at next visit.  - cont hyzaar 50/12.5 mg daily     3. 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  LDL 79 in September 2011 and 86 in January 2012 and 83 in 9/12  - cont simva 20 mg daily  - check lipids prior to next visit         Patient Instructions   1) Your Hemoglobin A1c (3 month test of blood sugar) is up slightly from 6.1% to 6.4% but is still at goal under 7% so we will continue to follow this for now.    2) Your blood pressure is up today but this may be from the steroid effect so we  will repeat this at the next visit.    3) Please fast for your labs.  Don't eat anything after midnight.            Follow-up Disposition:  Return in about 4 months (around 08/30/2011) for fasting labs.    Copy sent to:  [facility] [phone]

## 2011-10-13 LAB — LIPID PANEL
Cholesterol, total: 158 mg/dL (ref 100–199)
HDL Cholesterol: 37 mg/dL — ABNORMAL LOW (ref >39)
LDL, calculated: 85 mg/dL (ref 0–99)
Triglyceride: 180 mg/dL — ABNORMAL HIGH (ref 0–149)
VLDL, calculated: 36 mg/dL (ref 5–40)

## 2011-10-13 LAB — METABOLIC PANEL, COMPREHENSIVE
A-G Ratio: 1.9 (ref 1.1–2.5)
ALT (SGPT): 31 IU/L (ref 0–44)
AST (SGOT): 22 IU/L (ref 0–40)
Albumin: 4.4 g/dL (ref 3.5–4.8)
Alk. phosphatase: 66 IU/L (ref 25–160)
BUN/Creatinine ratio: 16 (ref 10–22)
BUN: 14 mg/dL (ref 8–27)
Bilirubin, total: 0.6 mg/dL (ref 0.0–1.2)
CO2: 18 mmol/L — ABNORMAL LOW (ref 19–28)
Calcium: 9.4 mg/dL (ref 8.6–10.2)
Chloride: 104 mmol/L (ref 97–108)
Creatinine: 0.87 mg/dL (ref 0.76–1.27)
GFR est non-AA: 86 mL/min/{1.73_m2} (ref >59)
GLOBULIN, TOTAL: 2.3 g/dL (ref 1.5–4.5)
Glucose: 156 mg/dL — ABNORMAL HIGH (ref 65–99)
Potassium: 4 mmol/L (ref 3.5–5.2)
Protein, total: 6.7 g/dL (ref 6.0–8.5)
Sodium: 139 mmol/L (ref 134–144)
eGFR If African American: 100 mL/min/{1.73_m2} (ref >59)

## 2011-10-13 LAB — HEMOGLOBIN A1C WITH EAG: Hemoglobin A1c: 6.9 % — ABNORMAL HIGH (ref 4.8–5.6)

## 2011-10-19 MED ORDER — SIMVASTATIN 20 MG TAB
20 mg | ORAL_TABLET | Freq: Every evening | ORAL | Status: DC
Start: 2011-10-19 — End: 2013-02-09

## 2011-10-19 MED ORDER — LOSARTAN-HYDROCHLOROTHIAZIDE 50 MG-12.5 MG TAB
ORAL_TABLET | Freq: Every day | ORAL | Status: DC
Start: 2011-10-19 — End: 2012-10-20

## 2011-10-19 NOTE — Progress Notes (Signed)
Chief Complaint   Patient presents with   ??? Diabetes     no pcp and pharmacy confirmed     History of Present Illness: Walter Molina is a 71 y.o. male here for follow up of diabetes.  Is currently on some sort of antibiotic for 2 more days for a tooth infection that led to a root canal.  Has not had any further steroid injections but is still having knee pain.  Weight is down 1.5 lb.  Fasting sugars are a little higher in the 130-150 range.  Hasn't checked at any other times of day.  Compliant with simvastatin and hyzaar.   Has been on the road more over the past 6 months so he's eating more fast food and not walking as much.      Current Outpatient Prescriptions   Medication Sig   ??? losartan-hydrochlorothiazide (HYZAAR) 50-12.5 mg per tablet Take 1 Tab by mouth daily.   ??? simvastatin (ZOCOR) 20 mg tablet Take 20 mg by mouth nightly.   ??? omeprazole (PRILOSEC) 20 mg capsule Take 20 mg by mouth daily.   ??? naproxen sodium (ALEVE) 220 mg tablet Take 220 mg by mouth as needed.     Allergies   Allergen Reactions   ??? Penicillins Rash     Review of Systems:  - Eyes: no blurry vision or double vision  - Cardiovascular: no chest pain  - Respiratory: no shortness of breath  - Musculoskeletal: no myalgias  - Neurological: no numbness/tingling in extremities    Physical Examination:  Blood pressure 116/70, pulse 52, height 5\' 11"  (1.803 m), weight 232 lb 12.8 oz (105.597 kg).  - General: pleasant, no distress, good eye contact   - Neck: no carotid bruits  - Cardiovascular: regular, normal rate, nl s1 and s2, no m/r/g, 2+ DP pulses   - Respiratory: clear bilaterally  - Integumentary: no edema, no foot ulcers  - Psychiatric: normal mood and affect    Data Reviewed:   Component      Latest Ref Rng 10/12/2011 10/12/2011 10/12/2011           9:22 AM  9:22 AM  9:22 AM   Glucose      65 - 99 mg/dL 409 (H)     BUN      8 - 27 mg/dL 14     Creatinine      0.76 - 1.27 mg/dL 8.11     GFR est non-AA      >59 mL/min/1.73 86     eGFR If Africn Am       >59 mL/min/1.73 100     BUN/Creatinine ratio      10 - 22 16     Sodium      134 - 144 mmol/L 139     Potassium      3.5 - 5.2 mmol/L 4.0     Chloride      97 - 108 mmol/L 104     CO2      19 - 28 mmol/L 18 (L)     Calcium      8.6 - 10.2 mg/dL 9.4     Protein, total      6.0 - 8.5 g/dL 6.7     Albumin      3.5 - 4.8 g/dL 4.4     GLOBULIN, TOTAL      1.5 - 4.5 g/dL 2.3     A-G Ratio      1.1 - 2.5 1.9  Bilirubin, total      0.0 - 1.2 mg/dL 0.6     Alk. phosphatase      25 - 160 IU/L 66     AST      0 - 40 IU/L 22     ALT      0 - 44 IU/L 31     Cholesterol, total      100 - 199 mg/dL   161   Triglyceride      0 - 149 mg/dL   096 (H)   HDL Cholesterol      >39 mg/dL   37 (L)   VLDL, calculated      5 - 40 mg/dL   36   LDL, calculated      0 - 99 mg/dL   85   Hemoglobin E4V      4.8 - 5.6 %  6.9 (H)        Assessment/Plan:     1. 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  He has been diet controlled for about 10 years and I would like to try and continue this for as long as possible.  Has done well with identifying his post-meal spikes and watching his intake of these foods.  A1c is up due to less exercise and eating out more  - cont diet control for now.  - check bs 4 times per week  - foot exam done 1/13  - optho UTD 7/12--due now  - microalbumin nl 1/13  - check Hgb A1c and cmp prior to next visit     2. Hypertension (401.9AJ) his BP was at goal < 130/80  - cont hyzaar 50/12.5 mg daily     3. 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  LDL 79 in September 2011 and 86 in January 2012 and 83 in 9/12 and 85 in 7/13.  - cont simva 20 mg daily  - check lipids prior to next visit         Patient Instructions   1) Your Hemoglobin A1c (3 month test of blood sugar) is up to 6.9% which is the  highest it's been but is still at goal under 7% so we will continue with diet and exercise for now.    2) Your triglycerides (short term fats) are slightly higher due to diet so these should come down with watching your diet.    3) Please go to your local [facility] 3-4 days before your next appointment to have your labs drawn.  Please fast for your labs.  Don't eat anything after midnight.          Follow-up Disposition:  Return in about 5 months (around 03/20/2012).    Copy sent to:  [facility] [phone]

## 2011-10-19 NOTE — Patient Instructions (Addendum)
1) Your Hemoglobin A1c (3 month test of blood sugar) is up to 6.9% which is the highest it's been but is still at goal under 7% so we will continue with diet and exercise for now.    2) Your triglycerides (short term fats) are slightly higher due to diet so these should come down with watching your diet.    3) Please go to your local [facility] 3-4 days before your next appointment to have your labs drawn.  Please fast for your labs.  Don't eat anything after midnight.

## 2012-03-17 LAB — METABOLIC PANEL, COMPREHENSIVE
A-G Ratio: 2 (ref 1.1–2.5)
ALT (SGPT): 30 IU/L (ref 0–44)
AST (SGOT): 20 IU/L (ref 0–40)
Albumin: 4.3 g/dL (ref 3.5–4.8)
Alk. phosphatase: 62 IU/L (ref 39–117)
BUN/Creatinine ratio: 18 (ref 10–22)
BUN: 18 mg/dL (ref 8–27)
Bilirubin, total: 0.6 mg/dL (ref 0.0–1.2)
CO2: 28 mmol/L (ref 19–28)
Calcium: 9.3 mg/dL (ref 8.6–10.2)
Chloride: 105 mmol/L (ref 97–108)
Creatinine: 1.02 mg/dL (ref 0.76–1.27)
GFR est AA: 84 mL/min/{1.73_m2} (ref >59)
GFR est non-AA: 73 mL/min/{1.73_m2} (ref >59)
GLOBULIN, TOTAL: 2.1 g/dL (ref 1.5–4.5)
Glucose: 173 mg/dL — ABNORMAL HIGH (ref 65–99)
Potassium: 4.6 mmol/L (ref 3.5–5.2)
Protein, total: 6.4 g/dL (ref 6.0–8.5)
Sodium: 144 mmol/L (ref 134–144)

## 2012-03-17 LAB — LIPID PANEL
Cholesterol, total: 146 mg/dL (ref 100–199)
HDL Cholesterol: 39 mg/dL — ABNORMAL LOW (ref >39)
LDL, calculated: 84 mg/dL (ref 0–99)
Triglyceride: 113 mg/dL (ref 0–149)
VLDL, calculated: 23 mg/dL (ref 5–40)

## 2012-03-17 LAB — HEMOGLOBIN A1C WITH EAG: Hemoglobin A1c: 6.7 % — ABNORMAL HIGH (ref 4.8–5.6)

## 2012-03-19 NOTE — Progress Notes (Signed)
Chief Complaint   Patient presents with   ??? Diabetes     pharmacy confirmed and pt uses [facility] in  [place]     History of Present Illness: Walter Molina is a 71 y.o. male here for follow up of diabetes.  Weight is down 1.5 lbs sine July.  Has been travelling a lot but has started carrying some weights when does walking.  Majority of readings are in the 130-140s.  No more antibiotics or steroid injections in the knee.  Compliant with simva and hyzaar.  May join [employer] or a gym.      Current Outpatient Prescriptions   Medication Sig   ??? ONE TOUCH ULTRA TEST strip Test up to once daily   ??? simvastatin (ZOCOR) 20 mg tablet Take 1 Tab by mouth nightly.   ??? losartan-hydrochlorothiazide (HYZAAR) 50-12.5 mg per tablet Take 1 Tab by mouth daily.   ??? omeprazole (PRILOSEC) 20 mg capsule Take 20 mg by mouth daily.   ??? naproxen sodium (ALEVE) 220 mg tablet Take 220 mg by mouth daily.     No current facility-administered medications for this visit.     Allergies   Allergen Reactions   ??? Penicillins Rash     Review of Systems:  - Eyes: no blurry vision or double vision  - Cardiovascular: no chest pain  - Respiratory: no shortness of breath  - Musculoskeletal: no myalgias  - Neurological: no numbness/tingling in extremities    Physical Examination:  Blood pressure 128/68, pulse 58, height 5\' 11"  (1.803 m), weight 231 lb 8 oz (105.008 kg).  - General: pleasant, no distress, good eye contact   - Neck: no carotid bruits  - Cardiovascular: regular, normal rate, nl s1 and s2, no m/r/g, 2+ DP pulses   - Respiratory: clear bilaterally  - Integumentary: no edema, no foot ulcers   Neurological: intact sensation to monofilament 4/4 locations, decreased vibratory sensation at great toes bilaterally,   - Psychiatric: normal mood and affect    Data Reviewed:   Component      Latest Ref Rng 03/16/2012 03/16/2012 03/16/2012           7:58 AM  7:58 AM  7:58 AM   Glucose      65 - 99 mg/dL 811 (H)     BUN      8 - 27 mg/dL 18     Creatinine       0.76 - 1.27 mg/dL 9.14     GFR est non-AA      >59 mL/min/1.73 73     GFR est AA      >59 mL/min/1.73 84     BUN/Creatinine ratio      10 - 22 18     Sodium      134 - 144 mmol/L 144     Potassium      3.5 - 5.2 mmol/L 4.6     Chloride      97 - 108 mmol/L 105     CO2      19 - 28 mmol/L 28     Calcium      8.6 - 10.2 mg/dL 9.3     Protein, total      6.0 - 8.5 g/dL 6.4     Albumin      3.5 - 4.8 g/dL 4.3     GLOBULIN, TOTAL      1.5 - 4.5 g/dL 2.1     A-G Ratio  1.1 - 2.5 2.0     Bilirubin, total      0.0 - 1.2 mg/dL 0.6     Alk. phosphatase      39 - 117 IU/L 62     AST      0 - 40 IU/L 20     ALT      0 - 44 IU/L 30     Cholesterol, total      100 - 199 mg/dL  161    Triglyceride      0 - 149 mg/dL  096    HDL Cholesterol      >39 mg/dL  39 (L)    VLDL, calculated      5 - 40 mg/dL  23    LDL, calculated      0 - 99 mg/dL  84    Hemoglobin E4V      4.8 - 5.6 %   6.7 (H)       Assessment/Plan:     1. 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  He has been diet controlled for about 10 years and I would like to try and continue this for as long as possible.  Has done well with identifying his post-meal spikes and watching his intake of these foods.  A1c is down from last check.  - cont diet control for now.  - check bs 4 times per week  - foot exam done 12/13  - optho UTD 9/13  - microalbumin nl 1/13  - check Hgb A1c and cmp and microalbumin prior to next visit     2. Hypertension (401.9AJ) his BP was at goal < 130/80  - cont hyzaar 50/12.5 mg daily     3. 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  LDL 79 in September 2011 and 86 in January 2012 and 83 in 9/12 and 85 in 7/13 and 84 in 12/13  - cont simva 20 mg daily  - check lipids prior to next visit          Patient Instructions   1) Your Hemoglobin A1c (3 month test of blood sugar) has come down to 6.7%.  Keep up the good work.    2) Your LDL (bad cholesterol) is still at goal under 100.    3) Your blood pressure is still at goal under 130/80.    4) Please go to your local [facility] 3-4 days before your next appointment to have your labs drawn.  Please fast for your labs.  Don't eat anything after midnight.  Be prepared to give a urine sample to test for any effect of the diabetes on your kidneys.        Follow-up Disposition:  Return in about 6 months (around 09/17/2012).    Copy sent to:  [facility] [phone]

## 2012-03-19 NOTE — Patient Instructions (Addendum)
1) Your Hemoglobin A1c (3 month test of blood sugar) has come down to 6.7%.  Keep up the good work.    2) Your LDL (bad cholesterol) is still at goal under 100.    3) Your blood pressure is still at goal under 130/80.    4) Please go to your local [facility] 3-4 days before your next appointment to have your labs drawn.  Please fast for your labs.  Don't eat anything after midnight.  Be prepared to give a urine sample to test for any effect of the diabetes on your kidneys.

## 2012-03-26 NOTE — Telephone Encounter (Addendum)
Message copied by Berdie Ogren on Mon Mar 26, 2012 11:51 AM  ------       Message from: Fredirick Maudlin A       Created: Mon Mar 26, 2012 10:32 AM       Regarding: RE: insulin needles needed       Contact: [phone]         I resent this with the diagnosis code and make sure they please process this.  Let the patient know this was taken care of.       ----- Message -----          From: Berdie Ogren          Sent: 03/26/2012  10:10 AM            To: Carylon Perches, MD       Subject: RE: insulin needles needed                                 The script was received however it was not processed because it did not have a dx code.  I spoke with the pharmacist Radene Knee.       ----- Message -----          From: Carylon Perches, MD          Sent: 03/26/2012   9:52 AM            To: Berdie Ogren       Subject: RE: insulin needles needed                                 I sent a prescription for lancets on 03/19/12.  Can you call the pharmacy and find out if they didn't receive them?       ----- Message -----          From: Berdie Ogren          Sent: 03/26/2012   9:48 AM            To: Carylon Perches, MD       Subject: FW: insulin needles needed                                 Lm x1       ----- Message -----          From: [name]          Sent: 03/26/2012   9:17 AM            To: Berdie Ogren       Subject: insulin needles needed                                     03/26/2012       9:18 AM                     Patient request am prescription for needles for his machine. He states he does not know the name of the machine(he showed Dr. Laural Benes the machine on his last visit.  Contact number is [phone].  Thanks       [name]                              ------        Per pharmacist the script was received with dx code and it is being filled.  Pt spouse was notified.

## 2012-09-14 LAB — METABOLIC PANEL, COMPREHENSIVE
A-G Ratio: 1.9 (ref 1.1–2.5)
ALT (SGPT): 34 IU/L (ref 0–44)
AST (SGOT): 22 IU/L (ref 0–40)
Albumin: 4.5 g/dL (ref 3.5–4.8)
Alk. phosphatase: 70 IU/L (ref 39–117)
BUN/Creatinine ratio: 17 (ref 10–22)
BUN: 18 mg/dL (ref 8–27)
Bilirubin, total: 0.7 mg/dL (ref 0.0–1.2)
CO2: 26 mmol/L (ref 19–28)
Calcium: 9.7 mg/dL (ref 8.6–10.2)
Chloride: 104 mmol/L (ref 97–108)
Creatinine: 1.04 mg/dL (ref 0.76–1.27)
GFR est AA: 82 mL/min/{1.73_m2} (ref >59)
GFR est non-AA: 71 mL/min/{1.73_m2} (ref >59)
GLOBULIN, TOTAL: 2.4 g/dL (ref 1.5–4.5)
Glucose: 173 mg/dL — ABNORMAL HIGH (ref 65–99)
Potassium: 4.5 mmol/L (ref 3.5–5.2)
Protein, total: 6.9 g/dL (ref 6.0–8.5)
Sodium: 145 mmol/L — ABNORMAL HIGH (ref 134–144)

## 2012-09-14 LAB — LIPID PANEL
Cholesterol, total: 149 mg/dL (ref 100–199)
HDL Cholesterol: 39 mg/dL — ABNORMAL LOW (ref >39)
LDL, calculated: 88 mg/dL (ref 0–99)
Triglyceride: 109 mg/dL (ref 0–149)
VLDL, calculated: 22 mg/dL (ref 5–40)

## 2012-09-14 LAB — MICROALBUMIN:CREATININE RATIO, RANDOM URINE
Creatinine, urine random: 170.4 mg/dL (ref 15.0–328.0)
Microalb/Creat ratio (ug/mg creat.): 4 mg/g creat (ref 0.0–30.0)
Microalbumin, urine: 6.9 ug/mL (ref 0.0–17.0)

## 2012-09-14 LAB — HEMOGLOBIN A1C WITH EAG: Hemoglobin A1c: 7 % — ABNORMAL HIGH (ref 4.8–5.6)

## 2012-09-17 NOTE — Progress Notes (Signed)
Chief Complaint   Patient presents with   ??? Diabetes     [facility]p is [facility] and pharmacy confirmed     History of Present Illness: Walter Molina is a 72 y.o. male here for follow up of diabetes.  Did have a cortisone shot in his knee on Friday but didn't check his sugar this weekend.  Has seen some readings in the 170s after [place] or [place] but most are in the 150s in the morning.  Hasn't checked at other times of day.  Has been under more stress at work.  Weight is stable.  Joined the gym in January and works with a Systems analyst twice a week for about an hour and does balance and cardio.  Hopes to start on machines tonight.  Went on a cruise over the past few months.    Current Outpatient Prescriptions   Medication Sig   ??? ONE TOUCH ULTRASOFT LANCETS misc Test once daily.  Dx 250.00   ??? ONE TOUCH ULTRA TEST strip Test up to once daily.  Dx 250.00   ??? simvastatin (ZOCOR) 20 mg tablet Take 1 Tab by mouth nightly.   ??? losartan-hydrochlorothiazide (HYZAAR) 50-12.5 mg per tablet Take 1 Tab by mouth daily.   ??? omeprazole (PRILOSEC) 20 mg capsule Take 20 mg by mouth daily.   ??? naproxen sodium (ALEVE) 220 mg tablet Take 220 mg by mouth as needed.     No current facility-administered medications for this visit.     Allergies   Allergen Reactions   ??? Penicillins Rash     Review of Systems:  - Eyes: no blurry vision or double vision  - Cardiovascular: no chest pain  - Respiratory: no shortness of breath  - Musculoskeletal: no myalgias  - Neurological: no numbness/tingling in extremities    Physical Examination:  Blood pressure 130/70, pulse 62, height 5\' 11"  (1.803 m), weight 231 lb 1.6 oz (104.826 kg).  - General: pleasant, no distress, good eye contact   - Neck: no carotid bruits  - Cardiovascular: regular, normal rate, nl s1 and s2, no m/r/g,    - Respiratory: clear bilaterally  - Integumentary: no edema,   - Psychiatric: normal mood and affect    Data Reviewed:   Component      Latest Ref Rng 09/13/2012 09/13/2012  09/13/2012           7:55 AM  7:55 AM  7:55 AM   Glucose      65 - 99 mg/dL   161 (H)   BUN      8 - 27 mg/dL   18   Creatinine      0.76 - 1.27 mg/dL   0.96   GFR est non-AA      >59 mL/min/1.73   71   GFR est AA      >59 mL/min/1.73   82   BUN/Creatinine ratio      10 - 22   17   Sodium      134 - 144 mmol/L   145 (H)   Potassium      3.5 - 5.2 mmol/L   4.5   Chloride      97 - 108 mmol/L   104   CO2      19 - 28 mmol/L   26   Calcium      8.6 - 10.2 mg/dL   9.7   Protein, total      6.0 - 8.5 g/dL   6.9  Albumin      3.5 - 4.8 g/dL   4.5   GLOBULIN, TOTAL      1.5 - 4.5 g/dL   2.4   A-G Ratio      1.1 - 2.5   1.9   Bilirubin, total      0.0 - 1.2 mg/dL   0.7   Alk. phosphatase      39 - 117 IU/L   70   AST      0 - 40 IU/L   22   ALT      0 - 44 IU/L   34   Cholesterol, total      100 - 199 mg/dL 161     Triglyceride      0 - 149 mg/dL 096     HDL Cholesterol      >39 mg/dL 39 (L)     VLDL, calculated      5 - 40 mg/dL 22     LDL, calculated      0 - 99 mg/dL 88     Hemoglobin E4V, (calculated)      4.8 - 5.6 %  7.0 (H)      Component      Latest Ref Rng 09/13/2012           7:55 AM   Creatinine,urine random      15.0 - 328.0 mg/dL 409.8   Microalbumin, urine      0.0 - 17.0 ug/mL 6.9   Microalbumin/Creat. Ratio      0.0 - 30.0 mg/g creat 4.0       Assessment/Plan:     1. 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  He has been diet controlled for about 10 years and I would like to try and continue this for as long as possible. A1c has been trending up slowly over the past 2 years and if next one is over 7% then I think we will plan to begin Metformin  - cont diet control for now.  - check bs 4 times per week  - foot exam done 12/13  - optho UTD 9/13  - microalbumin nl 6/14  - check Hgb A1c and bmp prior  to next visit     2. Hypertension (401.9AJ) his BP was at goal < 140/90  - cont hyzaar 50/12.5 mg daily     3. 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  LDL 79 in September 2011 and 86 in January 2012 and 83 in 9/12 and 85 in 7/13 and 84 in 12/13 and 88 in 6/14  - cont simva 20 mg daily  - check lipids in 8 months       Patient Instructions   1) Try to check some 2 hour after dinner readings a few times a week to see if you are going over 180 and if so, cut back on these foods.      2) Let's shoot for 5 lb weight loss over the next 5 months to get you weight close to 225 lb or less.    3) Your cholesterol is still at goal.    4) Your urine  protein is normal.      5) Please go to your local [facility] 3-4 days before your next appointment to have your labs drawn.  You won't need to fast for your labs before your next visit.    6) It's possible that your rise in A1c over the past 2 years is simply a progression of the natural history of diabetes where your pancreas doesn't make as much insulin as it did 2 years ago.  As long as we can keep your a1c at 7% or less, we won't need to start any medication.        Follow-up Disposition:  Return in about 5 months (around 02/17/2013).    Copy sent to:  [facility] [phone]

## 2012-09-17 NOTE — Patient Instructions (Addendum)
1) Try to check some 2 hour after dinner readings a few times a week to see if you are going over 180 and if so, cut back on these foods.      2) Let's shoot for 5 lb weight loss over the next 5 months to get you weight close to 225 lb or less.    3) Your cholesterol is still at goal.    4) Your urine protein is normal.      5) Please go to your local [facility] 3-4 days before your next appointment to have your labs drawn.  You won't need to fast for your labs before your next visit.    6) It's possible that your rise in A1c over the past 2 years is simply a progression of the natural history of diabetes where your pancreas doesn't make as much insulin as it did 2 years ago.  As long as we can keep your a1c at 7% or less, we won't need to start any medication.

## 2013-02-19 LAB — METABOLIC PANEL, BASIC
BUN/Creatinine ratio: 19 (ref 10–22)
BUN: 18 mg/dL (ref 8–27)
CO2: 23 mmol/L (ref 18–29)
Calcium: 9.4 mg/dL (ref 8.6–10.2)
Chloride: 101 mmol/L (ref 97–108)
Creatinine: 0.95 mg/dL (ref 0.76–1.27)
GFR est AA: 91 mL/min/{1.73_m2} (ref >59)
GFR est non-AA: 79 mL/min/{1.73_m2} (ref >59)
Glucose: 163 mg/dL — ABNORMAL HIGH (ref 65–99)
Potassium: 3.8 mmol/L (ref 3.5–5.2)
Sodium: 142 mmol/L (ref 134–144)

## 2013-02-19 LAB — HEMOGLOBIN A1C WITH EAG: Hemoglobin A1c: 6.7 % — ABNORMAL HIGH (ref 4.8–5.6)

## 2013-02-22 NOTE — Patient Instructions (Signed)
1) You have lost 8 lbs since last visit and your Hemoglobin A1c (3 month test of blood sugar) has dropped to 6.7%.  Keep up the good work.    2) Your blood pressure is at goal.    3) Please go to your local [facility] 3-4 days before your next appointment to have your labs drawn.  Please fast for your labs.  Don't eat anything after midnight.  Be prepared to give a urine sample to test for any effect of the diabetes on your kidneys.

## 2013-02-22 NOTE — Progress Notes (Signed)
Chief Complaint   Patient presents with   ??? Diabetes     pcp= [facility] and pharmacy confirmed     History of Present Illness: Walter Molina is a 72 y.o. male here for follow up of diabetes.  Weight down 8 lbs since last visit in 6/14.  Had been going to the gym twice a week until about a month ago when his wife hurt her leg and has backed off a little bit.  Has been working on the machines.  Fasting sugars are in the 150s and has seen some after meal readings as low as the 120s but most are around 160.    Current Outpatient Prescriptions   Medication Sig   ??? simvastatin (ZOCOR) 20 mg tablet TAKE 1 TABLET BY MOUTH NIGHTLY   ??? losartan-hydrochlorothiazide (HYZAAR) 50-12.5 mg per tablet TAKE 1 TABLET BY MOUTH EVERYDAY   ??? ONE TOUCH ULTRASOFT LANCETS misc Test once daily.  Dx 250.00   ??? ONE TOUCH ULTRA TEST strip Test up to once daily.  Dx 250.00   ??? omeprazole (PRILOSEC) 20 mg capsule Take 20 mg by mouth daily.   ??? naproxen sodium (ALEVE) 220 mg tablet Take 220 mg by mouth as needed.     No current facility-administered medications for this visit.     Allergies   Allergen Reactions   ??? Penicillins Rash     Review of Systems:  - Eyes: no blurry vision or double vision  - Cardiovascular: no chest pain  - Respiratory: no shortness of breath  - Musculoskeletal: no myalgias  - Neurological: no numbness/tingling in extremities    Physical Examination:  Blood pressure 128/70, pulse 50, height 5\' 11"  (1.803 m), weight 223 lb 6.4 oz (101.334 kg).  - General: pleasant, no distress, good eye contact   - Neck: no carotid bruits  - Cardiovascular: regular, normal rate, nl s1 and s2, no m/r/g, 2+ DP pulses   - Respiratory: clear bilaterally  - Integumentary: no edema, no foot ulcers, (+) callus formation   Neurological: decreased sensation to monofilament 4/4 locations, minimal vibratory sensation at great toes bilaterally,   - Psychiatric: normal mood and affect    Data Reviewed:   Component      Latest Ref Rng 02/18/2013  02/18/2013           8:03 AM  8:03 AM   Glucose      65 - 99 mg/dL  161 (H)   BUN      8 - 27 mg/dL  18   Creatinine      0.96 - 1.27 mg/dL  0.45   GFR est non-AA      >59 mL/min/1.73  79   GFR est AA      >59 mL/min/1.73  91   BUN/Creatinine ratio      10 - 22  19   Sodium      134 - 144 mmol/L  142   Potassium      3.5 - 5.2 mmol/L  3.8   Chloride      97 - 108 mmol/L  101   CO2      18 - 29 mmol/L  23   Calcium      8.6 - 10.2 mg/dL  9.4   Hemoglobin W0J, (calculated)      4.8 - 5.6 % 6.7 (H)        Assessment/Plan:     1. 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  He has been diet controlled for about 10 years and I would like to try and continue this for as long as possible. A1c had been trending up slowly over the past 2 years and in the future if one is over 7% then I think we will plan to begin Metformin.  Was able to get back down with weight loss.  - cont diet control for now.  - check bs 4 times per week  - foot exam done 11/14  - optho UTD 9/14  - microalbumin nl 6/14  - check Hgb A1c and cmp and microalbumin prior to next visit     2. Hypertension (401.9AJ) his BP was at goal < 140/90  - cont hyzaar 50/12.5 mg daily     3. 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  LDL 79 in September 2011 and 86 in January 2012 and 83 in 9/12 and 85 in 7/13 and 84 in 12/13 and 88 in 6/14  - cont simva 20 mg daily  - check lipids prior to next visit         Patient Instructions   1) You have lost 8 lbs since last visit and your Hemoglobin A1c (3 month test of blood sugar) has dropped to 6.7%.  Keep up the good work.    2) Your blood pressure is at goal.    3) Please go to your local [facility] 3-4 days before your next  appointment to have your labs drawn.  Please fast for your labs.  Don't eat anything after midnight.  Be prepared to give a urine sample to test for any effect of the diabetes on your kidneys.          Follow-up Disposition:  Return in about 6 months (around 08/22/2013).    Copy sent to:  [facility] [phone]

## 2013-06-14 MED ORDER — ONETOUCH ULTRASOFT LANCETS
Status: DC
Start: 2013-06-14 — End: 2013-10-07

## 2013-07-22 MED ORDER — ONETOUCH ULTRA TEST STRIPS
ORAL_STRIP | Status: DC
Start: 2013-07-22 — End: 2014-08-03

## 2013-08-15 MED ORDER — METFORMIN SR 500 MG 24 HR TABLET
500 mg | ORAL_TABLET | ORAL | Status: DC
Start: 2013-08-15 — End: 2014-04-07

## 2013-08-24 LAB — LIPID PANEL
Cholesterol, total: 154 mg/dL (ref 100–199)
HDL Cholesterol: 33 mg/dL — ABNORMAL LOW (ref >39)
LDL, calculated: 84 mg/dL (ref 0–99)
Triglyceride: 184 mg/dL — ABNORMAL HIGH (ref 0–149)
VLDL, calculated: 37 mg/dL (ref 5–40)

## 2013-08-24 LAB — METABOLIC PANEL, COMPREHENSIVE
A-G Ratio: 2.4 (ref 1.1–2.5)
ALT (SGPT): 28 IU/L (ref 0–44)
AST (SGOT): 28 IU/L (ref 0–40)
Albumin: 4.6 g/dL (ref 3.5–4.8)
Alk. phosphatase: 82 IU/L (ref 39–117)
BUN/Creatinine ratio: 17 (ref 10–22)
BUN: 16 mg/dL (ref 8–27)
Bilirubin, total: 0.5 mg/dL (ref 0.0–1.2)
CO2: 19 mmol/L (ref 18–29)
Calcium: 9.2 mg/dL (ref 8.6–10.2)
Chloride: 102 mmol/L (ref 97–108)
Creatinine: 0.95 mg/dL (ref 0.76–1.27)
GFR est AA: 91 mL/min/{1.73_m2} (ref >59)
GFR est non-AA: 79 mL/min/{1.73_m2} (ref >59)
GLOBULIN, TOTAL: 1.9 g/dL (ref 1.5–4.5)
Glucose: 187 mg/dL — ABNORMAL HIGH (ref 65–99)
Potassium: 4.4 mmol/L (ref 3.5–5.2)
Protein, total: 6.5 g/dL (ref 6.0–8.5)
Sodium: 140 mmol/L (ref 134–144)

## 2013-08-24 LAB — HEMOGLOBIN A1C WITH EAG: Hemoglobin A1c: 7.9 % — ABNORMAL HIGH (ref 4.8–5.6)

## 2013-08-24 LAB — MICROALBUMIN:CREATININE RATIO, RANDOM URINE
Creatinine, urine random: 119.4 mg/dL (ref 22.0–328.0)
Microalb/Creat ratio (ug/mg creat.): 4.9 mg/g creat (ref 0.0–30.0)
Microalbumin, urine: 5.9 ug/mL (ref 0.0–17.0)

## 2013-08-24 LAB — CVD REPORT

## 2013-08-29 NOTE — Patient Instructions (Addendum)
1) Please call [phone] to reset your mychart password.    2) If after 2 more weeks, your morning sugars are still staying over 130, then plan on adding a 3rd metformin pill and let me know so I can rewrite the quantity.      3) Check blood sugars once daily either fasting (goal is 80-130) or 2 hours after a meal (goal is less than 180).  Alternate one meal a day to check (example: one day check after breakfast, one day after lunch, one day after dinner).  Write down what you ate if your blood sugar is more than 180 so you can know to cut back or cut out this food from your diet.    4) Your triglycerides (short term fats) were slightly higher and your HDL (good cholesterol) was slightly lower due to 5 lb weight gain and higher A1c and both should improve with weight loss and better diabetes control.

## 2013-08-29 NOTE — Progress Notes (Signed)
Chief Complaint   Patient presents with   ??? Diabetes     no pcp and pharmacy confirmed     History of Present Illness: Walter Molina is a 73 y.o. male here for follow up of diabetes.  Weight up 4 lbs since last visit in 11/14.  This winter has been less active and under more stress at work and then needed surgery on his right 2nd toe in April and required antibiotics and prior to the surgery his blood sugars started rising and his wife contacted me 2 weeks ago and we started metformin initially at one tab after dinner and then has been on 2 tabs at dinner the past 2 days.  His fasting sugars had been over 200 and with starting metformin they are coming down and this morning was 171.  Hasn't checked too much after meals.  Compliant with zocor and hyzaar.  Hopes to get back into more walking to help lose weight.    Current Outpatient Prescriptions   Medication Sig   ??? metFORMIN ER (GLUCOPHAGE XR) 500 mg tablet Take 1 tab with dinner x 1 week.  If tolerating, increase to 2 tabs at dinner   ??? ONETOUCH ULTRA TEST strip Test up to once daily.  Dx 250.00   ??? ONETOUCH ULTRASOFT LANCETS misc TEST ONCE DAILY. DX 250.00   ??? simvastatin (ZOCOR) 20 mg tablet TAKE 1 TABLET BY MOUTH NIGHTLY   ??? losartan-hydrochlorothiazide (HYZAAR) 50-12.5 mg per tablet TAKE 1 TABLET BY MOUTH EVERYDAY   ??? omeprazole (PRILOSEC) 20 mg capsule Take 20 mg by mouth daily.   ??? naproxen sodium (ALEVE) 220 mg tablet Take 220 mg by mouth as needed.     No current facility-administered medications for this visit.     Allergies   Allergen Reactions   ??? Penicillins Rash     Review of Systems:  - Eyes: no blurry vision or double vision  - Cardiovascular: no chest pain  - Respiratory: no shortness of breath  - Musculoskeletal: no myalgias  - Neurological: no numbness/tingling in extremities    Physical Examination:  Blood pressure 124/68, pulse 61, height 5' 11" (1.803 m), weight 228 lb (103.42 kg).  - General: pleasant, no distress, good eye contact   - Neck:  no carotid bruits  - Cardiovascular: regular, normal rate, nl s1 and s2, 2/6 systolic murmur     - Respiratory: clear bilaterally  - Integumentary: no edema,  - Psychiatric: normal mood and affect    Data Reviewed:   Component      Latest Ref Rng 08/23/2013 08/23/2013 08/23/2013 08/23/2013           8:13 AM  8:13 AM  8:13 AM  8:13 AM   Glucose      65 - 99 mg/dL 187 (H)      BUN      8 - 27 mg/dL 16      Creatinine      0.76 - 1.27 mg/dL 0.95      GFR est non-AA      >59 mL/min/1.73 79      GFR est AA      >59 mL/min/1.73 91      BUN/Creatinine ratio      10 - 22 17      Sodium      134 - 144 mmol/L 140      Potassium      3.5 - 5.2 mmol/L 4.4      Chloride  97 - 108 mmol/L 102      CO2      18 - 29 mmol/L 19      Calcium      8.6 - 10.2 mg/dL 9.2      Protein, total      6.0 - 8.5 g/dL 6.5      Albumin      3.5 - 4.8 g/dL 4.6      GLOBULIN, TOTAL      1.5 - 4.5 g/dL 1.9      A-G Ratio      1.1 - 2.5 2.4      Bilirubin, total      0.0 - 1.2 mg/dL 0.5      Alk. phosphatase      39 - 117 IU/L 82      AST      0 - 40 IU/L 28      ALT      0 - 44 IU/L 28      Cholesterol, total      100 - 199 mg/dL  154     Triglyceride      0 - 149 mg/dL  184 (H)     HDL Cholesterol      >39 mg/dL  33 (L)     VLDL, calculated      5 - 40 mg/dL  37     LDL, calculated      0 - 99 mg/dL  84     Creatinine,urine random      22.0 - 328.0 mg/dL   119.4    Microalbumin, urine      0.0 - 17.0 ug/mL   5.9    Microalbumin/Creat. Ratio      0.0 - 30.0 mg/g creat   4.9    Hemoglobin A1c, (calculated)      4.8 - 5.6 %    7.9 (H)       Assessment/Plan:     1. 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  Just started metformin 2 weeks ago and will titrate as needed to get fbs < 130.  - cont  metformin ER 500 mg 2 tabs at dinner  - check bs 4 times per week  - foot exam done 11/14  - optho UTD 9/14  - microalbumin nl 5/15  - check Hgb A1c and cmp prior to next visit     2. Hypertension (401.9AJ) his BP was at goal < 140/90  - cont hyzaar 50/12.5 mg daily     3. 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  LDL 79 in September 2011 and 86 in January 2012 and 83 in 9/12 and 85 in 7/13 and 84 in 12/13 and 88 in 6/14.  LDL 84 in 5/15 but TG up to 184 due to wt gain and higher A1c.  - cont simva 20 mg daily  - check lipids prior to next visit         Patient Instructions   1) Please call [phone] to reset your mychart password.    2) If after 2 more weeks, your  morning sugars are still staying over 130, then plan on adding a 3rd metformin pill and let me know so I can rewrite the quantity.      3) Check blood sugars once daily either fasting (goal is 80-130) or 2 hours after a meal (goal is less than 180).  Alternate one meal a day to check (example: one day check after breakfast, one day after lunch, one day after dinner).  Write down what you ate if your blood sugar is more than 180 so you can know to cut back or cut out this food from your diet.    4) Your triglycerides (short term fats) were slightly higher and your HDL (good cholesterol) was slightly lower due to 5 lb weight gain and higher A1c and both should improve with weight loss and better diabetes control.            Follow-up Disposition:  Return in about 4 months (around 12/30/2013).    Copy sent to:  [facility] [phone]

## 2013-10-08 MED ORDER — ONETOUCH ULTRASOFT LANCETS
Status: DC
Start: 2013-10-08 — End: 2015-01-02

## 2013-10-21 MED ORDER — LOSARTAN-HYDROCHLOROTHIAZIDE 50 MG-12.5 MG TAB
ORAL_TABLET | ORAL | Status: DC
Start: 2013-10-21 — End: 2014-10-23

## 2013-11-10 LAB — METABOLIC PANEL, COMPREHENSIVE
A-G Ratio: 1.3 (ref 1.1–2.2)
ALT (SGPT): 42 U/L (ref 12–78)
AST (SGOT): 20 U/L (ref 15–37)
Albumin: 3.8 g/dL (ref 3.5–5.0)
Alk. phosphatase: 79 U/L (ref 45–117)
Anion gap: 12 mmol/L (ref 5–15)
BUN/Creatinine ratio: 18 (ref 12–20)
BUN: 13 MG/DL (ref 6–20)
Bilirubin, total: 0.7 MG/DL (ref 0.2–1.0)
CO2: 22 mmol/L (ref 21–32)
Calcium: 8.5 MG/DL (ref 8.5–10.1)
Chloride: 105 mmol/L (ref 97–108)
Creatinine: 0.71 MG/DL (ref 0.45–1.15)
GFR est AA: >60 mL/min/{1.73_m2} (ref >60)
GFR est non-AA: >60 mL/min/{1.73_m2} (ref >60)
Globulin: 2.9 g/dL (ref 2.0–4.0)
Glucose: 206 mg/dL — ABNORMAL HIGH (ref 65–100)
Potassium: 3.3 mmol/L — ABNORMAL LOW (ref 3.5–5.1)
Protein, total: 6.7 g/dL (ref 6.4–8.2)
Sodium: 139 mmol/L (ref 136–145)

## 2013-11-10 LAB — CBC WITH AUTOMATED DIFF
ABS. BASOPHILS: 0 10*3/uL (ref 0.0–0.1)
ABS. EOSINOPHILS: 0 10*3/uL (ref 0.0–0.4)
ABS. LYMPHOCYTES: 1.5 10*3/uL (ref 0.8–3.5)
ABS. MONOCYTES: 0.3 10*3/uL (ref 0.0–1.0)
ABS. NEUTROPHILS: 4.6 10*3/uL (ref 1.8–8.0)
BASOPHILS: 0 % (ref 0–1)
EOSINOPHILS: 1 % (ref 0–7)
HCT: 40.5 % (ref 36.6–50.3)
HGB: 14.1 g/dL (ref 12.1–17.0)
LYMPHOCYTES: 24 % (ref 12–49)
MCH: 32.5 PG (ref 26.0–34.0)
MCHC: 34.8 g/dL (ref 30.0–36.5)
MCV: 93.3 FL (ref 80.0–99.0)
MONOCYTES: 5 % (ref 5–13)
NEUTROPHILS: 70 % (ref 32–75)
PLATELET: 150 10*3/uL (ref 150–400)
RBC: 4.34 M/uL (ref 4.10–5.70)
RDW: 13.7 % (ref 11.5–14.5)
WBC: 6.5 10*3/uL (ref 4.1–11.1)

## 2013-11-10 LAB — TROPONIN I
Troponin-I, Qt.: <0.04 ng/mL (ref <0.05)
Troponin-I, Qt.: <0.04 ng/mL (ref <0.05)

## 2013-11-10 LAB — CK W/ CKMB & INDEX
CK - MB: 3.6 NG/ML (ref 0.5–3.6)
CK-MB Index: 4.2 — ABNORMAL HIGH (ref 0–2.5)
CK: 86 U/L (ref 39–308)

## 2013-11-10 LAB — GLUCOSE, POC: Glucose (POC): 206 mg/dL — ABNORMAL HIGH (ref 65–100)

## 2013-11-10 LAB — LIPASE: Lipase: 122 U/L (ref 73–393)

## 2013-11-10 MED ORDER — MECLIZINE 12.5 MG TAB
12.5 mg | ORAL | Status: AC
Start: 2013-11-10 — End: 2013-11-10
  Administered 2013-11-10: 21:00:00 via ORAL

## 2013-11-10 MED ORDER — ONDANSETRON 4 MG TAB, RAPID DISSOLVE
4 mg | ORAL_TABLET | Freq: Three times a day (TID) | ORAL | Status: DC | PRN
Start: 2013-11-10 — End: 2013-11-12

## 2013-11-10 MED ORDER — ONDANSETRON (PF) 4 MG/2 ML INJECTION
4 mg/2 mL | INTRAMUSCULAR | Status: AC
Start: 2013-11-10 — End: 2013-11-10
  Administered 2013-11-10: 19:00:00 via INTRAVENOUS

## 2013-11-10 MED ORDER — SODIUM CHLORIDE 0.9 % IJ SYRG
INTRAMUSCULAR | Status: DC | PRN
Start: 2013-11-10 — End: 2013-11-10

## 2013-11-10 MED ORDER — SODIUM CHLORIDE 0.9 % IJ SYRG
Freq: Three times a day (TID) | INTRAMUSCULAR | Status: DC
Start: 2013-11-10 — End: 2013-11-10

## 2013-11-10 MED ORDER — MECLIZINE 25 MG TAB
25 mg | ORAL_TABLET | Freq: Three times a day (TID) | ORAL | Status: DC | PRN
Start: 2013-11-10 — End: 2014-01-06

## 2013-11-10 MED ORDER — ONDANSETRON (PF) 4 MG/2 ML INJECTION
4 mg/2 mL | INTRAMUSCULAR | Status: AC
Start: 2013-11-10 — End: 2013-11-10
  Administered 2013-11-10: 17:00:00 via INTRAVENOUS

## 2013-11-10 MED ADMIN — sodium chloride 0.9 % bolus infusion 1,000 mL: INTRAVENOUS | @ 17:00:00 | NDC 00409798309

## 2013-11-10 MED FILL — ONDANSETRON (PF) 4 MG/2 ML INJECTION: 4 mg/2 mL | INTRAMUSCULAR | Qty: 2

## 2013-11-10 MED FILL — SODIUM CHLORIDE 0.9 % IV: INTRAVENOUS | Qty: 1000

## 2013-11-10 MED FILL — MECLIZINE 12.5 MG TAB: 12.5 mg | ORAL | Qty: 2

## 2013-11-10 NOTE — ED Notes (Signed)
Patient ate 80% of meal, denies nausea.

## 2013-11-10 NOTE — ED Provider Notes (Signed)
HPI Comments: Walter Molina is a 73 y.o. male who presents ambulatory to [facility] ED with c/o sudden onset dizziness with associated N/V and diaphoresis while at church this AM. Pt describes his dizziness as "A woozy feeling, not like this room is spinning". Pt denies the use of beta blockers and denies hx of cholecystectomy. Pt recalls hx of stress test more than 8 years ago, but denies any recent stress test.   Pt specifically denies CP, SOB, loss of appetite, indigestion.     PMhx is significant for: HTN, GERD, Hyperlipidemia, DM  SMhx is significant for: Appendectomy, Colectomy, Tonsillectomy  Social hx: - Smoke, + EtOH (Rarely), - Illicit Drugs    There are no other complaints, changes or physical findings at this time.  Written by Milford Cage, ED Scribe, as dictated by Rada Hay, DO.        The history is provided by the patient.        Past Medical History   Diagnosis Date   ??? Hypertension    ??? GERD (gastroesophageal reflux disease)    ??? Arthritis      knees   ??? Other ill-defined conditions(799.89)      diverticular disease/colon   ??? Other and unspecified hyperlipidemia    ??? Type II or unspecified type diabetes mellitus without mention of complication, not stated as uncontrolled ([place])    ??? Seasonal allergic rhinitis         Past Surgical History   Procedure Laterality Date   ??? Pr abdomen surgery proc unlisted       colectomy/12 years ago   ??? Hx appendectomy     ??? Hx other surgical       tonsillectomy   ??? Colonoscopy  10/10/2008         ??? Hx orthopaedic       bunionectomy b/l   ??? Hx cataract removal Bilateral 9/13   ??? Hx orthopaedic  07/24/13     right 2nd toe had bone spur removed         Family History   Problem Relation Age of Onset   ??? Diabetes Father    ??? Stroke Father 37   ??? Diabetes Other      cousin   ??? Heart Disease Neg Hx         History     Social History   ??? Marital Status: MARRIED     Spouse Name: N/A     Number of Children: N/A   ??? Years of Education: N/A     Occupational History    ??? Not on file.     Social History Main Topics   ??? Smoking status: Never Smoker    ??? Smokeless tobacco: Not on file   ??? Alcohol Use: Yes      Comment: very rarely may have a beer or glass of wine   ??? Drug Use: No   ??? Sexual Activity: Not on file     Other Topics Concern   ??? Not on file     Social History Narrative    Lives in [place] with wife of 9 years.  Has 2 sons from a previous marriage.    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    Likes to work in the yard.                    ALLERGIES: Penicillins      Review  of Systems   Constitutional: Positive for diaphoresis. Negative for fever, chills, appetite change and fatigue.   HENT: Negative.  Negative for congestion, rhinorrhea, sinus pressure and sore throat.    Eyes: Negative.    Respiratory: Negative.  Negative for cough, choking, chest tightness, shortness of breath and wheezing.    Cardiovascular: Negative.  Negative for chest pain, palpitations and leg swelling.   Gastrointestinal: Positive for nausea and vomiting. Negative for abdominal pain, diarrhea and constipation.   Endocrine: Negative.    Genitourinary: Negative.  Negative for dysuria, urgency, flank pain and difficulty urinating.   Musculoskeletal: Negative.    Skin: Negative.    Neurological: Positive for dizziness. Negative for speech difficulty, weakness, light-headedness, numbness and headaches.   Psychiatric/Behavioral: Negative.    All other systems reviewed and are negative.      Filed Vitals:    11/10/13 1227 11/10/13 1242   BP: 139/69 143/59   Pulse: 44 49   Temp: 97.9 ??F (36.6 ??C)    Resp: 18 16   Height: '5\' 11"'  (1.803 m)    Weight: 100 kg (220 lb 7.4 oz)    SpO2: 98% 99%            Physical Exam   Constitutional: He is oriented to person, place, and time. He appears well-developed and well-nourished. No distress.   HENT:   Head: Normocephalic and atraumatic.   Mouth/Throat: Oropharynx is clear and moist. No oropharyngeal exudate.    Eyes: Conjunctivae and EOM are normal. Pupils are equal, round, and reactive to light.   Neck: Normal range of motion. Neck supple. No JVD present. No tracheal deviation present.   Cardiovascular: Regular rhythm, normal heart sounds and intact distal pulses.    No murmur heard.  Bradycardia     Pulmonary/Chest: Effort normal and breath sounds normal. No stridor. No respiratory distress. He has no wheezes. He has no rales. He exhibits no tenderness.   Abdominal: Soft. He exhibits no distension. There is no tenderness. There is no rebound and no guarding.   Musculoskeletal: Normal range of motion. He exhibits no edema or tenderness.   Neurological: He is alert and oriented to person, place, and time. No cranial nerve deficit.   No focal motor or sensory deficits, no ataxia, no dysmetria, no nystagmus     Skin: Skin is warm and dry. He is not diaphoretic.   Psychiatric: He has a normal mood and affect. His behavior is normal.   Nursing note and vitals reviewed.       MDM  Number of Diagnoses or Management Options  Diagnosis management comments: DDx: Bradycardia, Heart Block, Electrolyte Abnormality, Gall Bladder Disease, NSTEMI       Amount and/or Complexity of Data Reviewed  Clinical lab tests: ordered and reviewed  Tests in the medicine section of CPT??: ordered and reviewed  Review and summarize past medical records: yes  Independent visualization of images, tracings, or specimens: yes        Procedures     ED EKG interpretation:  Rhythm: sinus bradycardia; and regular . Rate (approx.): 46; Axis: normal; P wave: normal; QRS interval: normal ; ST/T wave: normal;. This EKG was interpreted by Rada Hay, DO,ED Provider.        LABORATORY TESTS:  Recent Results (from the past 12 hour(s))   EKG, 12 LEAD, INITIAL    Collection Time: 11/10/13 12:33 PM   Result Value Ref Range    Ventricular Rate 46 BPM    Atrial Rate  46 BPM    P-R Interval 176 ms    QRS Duration 110 ms    Q-T Interval 478 ms     QTC Calculation (Bezet) 418 ms    Calculated P Axis 31 degrees    Calculated R Axis -14 degrees    Calculated T Axis 31 degrees    Diagnosis Sinus bradycardia  No previous ECGs available      GLUCOSE, POC    Collection Time: 11/10/13 12:48 PM   Result Value Ref Range    Glucose (POC) 206 (H) 65 - 100 mg/dL    Performed by Whitney Post    CBC WITH AUTOMATED DIFF    Collection Time: 11/10/13 12:50 PM   Result Value Ref Range    WBC 6.5 4.1 - 11.1 K/uL    RBC 4.34 4.10 - 5.70 M/uL    HGB 14.1 12.1 - 17.0 g/dL    HCT 40.5 36.6 - 50.3 %    MCV 93.3 80.0 - 99.0 FL    MCH 32.5 26.0 - 34.0 PG    MCHC 34.8 30.0 - 36.5 g/dL    RDW 13.7 11.5 - 14.5 %    PLATELET 150 150 - 400 K/uL    NEUTROPHILS 70 32 - 75 %    LYMPHOCYTES 24 12 - 49 %    MONOCYTES 5 5 - 13 %    EOSINOPHILS 1 0 - 7 %    BASOPHILS 0 0 - 1 %    ABS. NEUTROPHILS 4.6 1.8 - 8.0 K/UL    ABS. LYMPHOCYTES 1.5 0.8 - 3.5 K/UL    ABS. MONOCYTES 0.3 0.0 - 1.0 K/UL    ABS. EOSINOPHILS 0.0 0.0 - 0.4 K/UL    ABS. BASOPHILS 0.0 0.0 - 0.1 K/UL   CK W/ CKMB & INDEX    Collection Time: 11/10/13 12:50 PM   Result Value Ref Range    CK 86 39 - 308 U/L    CK - MB 3.6 0.5 - 3.6 NG/ML    CK-MB Index 4.2 (H) 0 - 2.5     METABOLIC PANEL, COMPREHENSIVE    Collection Time: 11/10/13 12:50 PM   Result Value Ref Range    Sodium 139 136 - 145 mmol/L    Potassium 3.3 (L) 3.5 - 5.1 mmol/L    Chloride 105 97 - 108 mmol/L    CO2 22 21 - 32 mmol/L    Anion gap 12 5 - 15 mmol/L    Glucose 206 (H) 65 - 100 mg/dL    BUN 13 6 - 20 MG/DL    Creatinine 0.71 0.45 - 1.15 MG/DL    BUN/Creatinine ratio 18 12 - 20      GFR est AA >60 >60 ml/min/1.54m    GFR est non-AA >60 >60 ml/min/1.733m   Calcium 8.5 8.5 - 10.1 MG/DL    Bilirubin, total 0.7 0.2 - 1.0 MG/DL    ALT 42 12 - 78 U/L    AST 20 15 - 37 U/L    Alk. phosphatase 79 45 - 117 U/L    Protein, total 6.7 6.4 - 8.2 g/dL    Albumin 3.8 3.5 - 5.0 g/dL    Globulin 2.9 2.0 - 4.0 g/dL    A-G Ratio 1.3 1.1 - 2.2     TROPONIN I     Collection Time: 11/10/13 12:50 PM   Result Value Ref Range    Troponin-I, Qt. <0.04 <0.05 ng/mL   LIPASE    Collection  Time: 11/10/13 12:50 PM   Result Value Ref Range    Lipase 122 73 - 393 U/L   TROPONIN I    Collection Time: 11/10/13  3:45 PM   Result Value Ref Range    Troponin-I, Qt. <0.04 <0.05 ng/mL       IMAGING RESULTS:  [place] ABD LTD   Final Result   **Final Report**          ICD Codes / Adm.Diagnosis: 100002  120 / Dizziness  Vomiting   Examination:  US ABDOMEN LTD  - [id number] - Nov 10 2013  2:50PM   Accession No:  [id number]   Reason:  RUQ, nausea, vomiting         REPORT:   History: Right upper quadrant pain.      Ultrasonography of the right upper quadrant demonstrates that the liver is       echogenic. There is no intrahepatic duct dilatation or mass. Portal vein    flow is antegrade. The gallbladder is fluid filled. There is no    cholelithiasis or pericholecystic fluid collection. The pancreas appears    unremarkable. The common bile duct measures 5.2 mm. The right kidney    measures 11.6 cm. The kidneys are normal in echotexture without    hydronephrosis or mass.           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    No acute findings.                   Signing/Reading Doctor: Erlene Senters [phone])     Approved: Erlene Senters [phone])  Nov 10 2013  1:51PM                                          MEDICATIONS GIVEN:  Medications   sodium chloride (NS) flush 5-10 mL (not administered)   sodium chloride (NS) flush 5-10 mL (not administered)   meclizine (ANTIVERT) tablet 25 mg (not administered)   sodium chloride 0.9 % bolus infusion 1,000 mL (0 mL IntraVENous IV Completed 11/10/13 1529)   ondansetron (ZOFRAN) injection 4 mg (4 mg IntraVENous Given 11/10/13 1253)   ondansetron (ZOFRAN) injection 4 mg (4 mg IntraVENous Given 11/10/13 1529)       IMPRESSION:  1. Dizziness    2. Bradycardia        PLAN:  1. Rx: Meclizine, Zofran ODT   2. Follow up with your PCP for re-evaluation.  3. Follow up with Christian Mate. Wittkamp, MD for re-evaluation as needed.   Return to ED if worse     DISCHARGE NOTE  4:57 PM    Walter Molina is ready for discharge. The pt's signs/symptoms, results, diagnosis and discharge instructions have been discussed w/ the pt and/or available family members. The pt conveys understanding and agreement with the diagnosis and plan. There are no new physical findings, changes or complaints at this time. Pt is recommended to follow up with PCP for re-evaluation, follow up with Christian Mate. Wittkamp, MD for re-evaluation as needed, or return to ED if condition worsens. Will discharge w/ Meclizine and Zofran ODT.  Written by Milford Cage, ED Scribe, as dictated by Rada Hay, DO.

## 2013-11-10 NOTE — ED Notes (Signed)
Pt arrived to room via wheelchair.  Pt was diaphoretic and vomiting.  Pt complaining of nausea.  States that he woke up feeling fine but started having dizziness while in [employer] and it progressively got worse.  Pt connected to cardiac monitor with continuous pulse ox and BP every 15 min.  Pt's wife at bedside.  Call bell in reach

## 2013-11-10 NOTE — ED Notes (Signed)
Dr. Kizzie Bane provided patient with verbal and written discharge instructions.  Patient discharged via wheelchair with wife to transport home.

## 2013-11-10 NOTE — ED Notes (Signed)
Patient ambulated in hallway with slight unsteady gain, per patient "a little wobbly" and c/o slight dizziness.  Dr. Kizzie BaneHughes observed patient ambulating and now at bedside talking with patient.

## 2013-11-10 NOTE — ED Notes (Signed)
Dr. Hughes at bedside reevaluating patient.

## 2013-11-10 NOTE — ED Notes (Signed)
Assumed care of patient.  Patient alert and oriented x 3, reports mild nausea and left side abdominal pain with position change, denies dizziness with resting, aware of awaiting reevaluation by ER MD.

## 2013-11-10 NOTE — ED Notes (Signed)
Pt stood to void and states that his dizziness was worse and that he still had some nausea.

## 2013-11-10 NOTE — ED Notes (Signed)
Meal tray ordered.

## 2013-11-11 LAB — EKG, 12 LEAD, INITIAL
Atrial Rate: 46 {beats}/min
Calculated P Axis: 31 degrees
Calculated R Axis: -14 degrees
Calculated T Axis: 31 degrees
P-R Interval: 176 ms
Q-T Interval: 478 ms
QRS Duration: 110 ms
QTC Calculation (Bezet): 418 ms
Ventricular Rate: 46 {beats}/min

## 2013-11-12 NOTE — Progress Notes (Signed)
Garret Reddish NP    Subjective/HPI:     Walter Molina is a 73 y.o. male is here for new patient consultation.  The patient reports a episode where he was in church developed dizziness, diaphoresis n/v and fatigue. Transported to [facility] ER negative acute work up.  Pt reports this was a isolated event, he is able to work outdoors well without chest pain, dizziness or DOE.     Patient Active Problem List    Diagnosis Date Noted   ??? 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 Hypertension    ??? Other and unspecified hyperlipidemia       Colbert Ewing, MD (General)  Past Medical History   Diagnosis Date   ??? Hypertension    ??? GERD (gastroesophageal reflux disease)    ??? Arthritis      knees   ??? Other ill-defined conditions(799.89)      diverticular disease/colon   ??? Other and unspecified hyperlipidemia    ??? Type II or unspecified type diabetes mellitus without mention of complication, not stated as uncontrolled ([place])    ??? Seasonal allergic rhinitis       Past Surgical History   Procedure Laterality Date   ??? Pr abdomen surgery proc unlisted       colectomy/12 years ago   ??? Hx appendectomy     ??? Hx other surgical       tonsillectomy   ??? Colonoscopy  10/10/2008         ??? Hx orthopaedic       bunionectomy b/l   ??? Hx cataract removal Bilateral 9/13   ??? Hx orthopaedic  07/24/13     right 2nd toe had bone spur removed     Allergies   Allergen Reactions   ??? Penicillins Rash      Family History   Problem Relation Age of Onset   ??? Diabetes Father    ??? Stroke Father 72   ??? Diabetes Other      cousin   ??? Heart Disease Neg Hx       Current Outpatient Prescriptions   Medication Sig   ??? meclizine (ANTIVERT) 25 mg tablet Take 1 Tab by mouth three (3) times daily as needed for Dizziness.   ??? losartan-hydrochlorothiazide (HYZAAR) 50-12.5 mg per tablet TAKE 1 TABLET BY MOUTH EVERYDAY   ??? ONETOUCH ULTRASOFT LANCETS misc TEST ONCE DAILY. DX 250.00   ??? metFORMIN ER (GLUCOPHAGE XR) 500 mg tablet Take 1 tab with dinner x 1  week.  If tolerating, increase to 2 tabs at dinner   ??? ONETOUCH ULTRA TEST strip Test up to once daily.  Dx 250.00   ??? simvastatin (ZOCOR) 20 mg tablet TAKE 1 TABLET BY MOUTH NIGHTLY   ??? omeprazole (PRILOSEC) 20 mg capsule Take 20 mg by mouth daily.   ??? naproxen sodium (ALEVE) 220 mg tablet Take 220 mg by mouth as needed.     No current facility-administered medications for this visit.      Filed Vitals:    11/12/13 1019 11/12/13 1025   BP: 132/78 138/80   Pulse: 56    Height: '5\' 11"'  (1.803 m)    Weight: 223 lb (101.152 kg)        I have reviewed the nurses notes, vitals, problem list, allergy list, medical history, family, social history and medications.    Review of Symptoms:at present     General: Pt denies excessive weight gain or loss. Pt is able  to conduct ADL's  HEENT: Denies blurred vision, headaches, epistaxis and difficulty swallowing.  Respiratory: Denies shortness of breath, DOE, wheezing or stridor.  Cardiovascular: Denies precordial pain, palpitations, edema or PND  Gastrointestinal: Denies poor appetite, indigestion, abdominal pain or blood in stool  Musculoskeletal: Denies pain or swelling from muscles or joints  Neurologic: Denies tremor, paresthesias, or sensory motor disturbance  Skin: Denies rash, itching or texture change.      Physical Exam: ??    General: Well developed, cooperative, alert in no acute distress, appears states age.  HEENT: Supple, No carotid bruits, no JVD, trach is midline. PERRL, EOM intact  Heart: ??Normal S1/S2 negative S3 or S4. Regular, 1/6 systolic  murmur, no gallop or rub.??  Respiratory: Clear bilaterally x 4, no wheezing or rales  Abdomen:?? ??Soft, non-tender, no masses, bowel sounds are active.??  Extremities:  No edema, normal cap refill, no cyanosis, atraumatic.  Neuro: A&Ox3, speech clear, gait stable.   Skin: Skin color is normal. No rashes or lesions. Non diaphoretic  Vascular: 2+ pulses symmetric in all extremities    Cardiographics     ECG: Sinus with frequent PAC's   Results for orders placed or performed during the hospital encounter of 11/10/13   EKG, 12 LEAD, INITIAL   Result Value Ref Range    Ventricular Rate 46 BPM    Atrial Rate 46 BPM    P-R Interval 176 ms    QRS Duration 110 ms    Q-T Interval 478 ms    QTC Calculation (Bezet) 418 ms    Calculated P Axis 31 degrees    Calculated R Axis -14 degrees    Calculated T Axis 31 degrees    Diagnosis       Sinus bradycardia  No previous ECGs available  Confirmed by Tyson Babinski [phone]) on 11/11/2013 8:21:28 AM           Cardiology Labs:  Lab Results   Component Value Date/Time    CHOLESTEROL, TOTAL 154 08/23/2013 08:13 AM    HDL CHOLESTEROL 33 08/23/2013 08:13 AM    LDL, CALCULATED 84 08/23/2013 08:13 AM    TRIGLYCERIDE 184 08/23/2013 08:13 AM       Lab Results   Component Value Date/Time    SODIUM 139 11/10/2013 12:50 PM    POTASSIUM 3.3 11/10/2013 12:50 PM    CHLORIDE 105 11/10/2013 12:50 PM    CO2 22 11/10/2013 12:50 PM    ANION GAP 12 11/10/2013 12:50 PM    GLUCOSE 206 11/10/2013 12:50 PM    BUN 13 11/10/2013 12:50 PM    CREATININE 0.71 11/10/2013 12:50 PM    BUN/CREATININE RATIO 18 11/10/2013 12:50 PM    GFR EST AA >60 11/10/2013 12:50 PM    GFR EST NON-AA >60 11/10/2013 12:50 PM    CALCIUM 8.5 11/10/2013 12:50 PM    BILIRUBIN, TOTAL 0.7 11/10/2013 12:50 PM    ALT 42 11/10/2013 12:50 PM    AST 20 11/10/2013 12:50 PM    ALK. PHOSPHATASE 79 11/10/2013 12:50 PM    PROTEIN, TOTAL 6.7 11/10/2013 12:50 PM    ALBUMIN 3.8 11/10/2013 12:50 PM    GLOBULIN 2.9 11/10/2013 12:50 PM    A-G RATIO 1.3 11/10/2013 12:50 PM           Assessment:     Assessment:      Walter Molina was seen today for new patient.    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 Essential hypertension 401.9 AMB POC EKG ROUTINE W/ 12 LEADS, INTER & REP     2D ECHO COMPLETE ADULT (TTE) W OR WO CONTR     ECHO TTE STRESS EXRCSE COMP W OR WO CONTR     LOOP MONITOR   2. DM w/o complication type II ([facility]) 250.00 2D ECHO COMPLETE ADULT (TTE) W OR WO CONTR     ECHO TTE STRESS EXRCSE COMP W OR WO CONTR     LOOP MONITOR   3. Other and unspecified hyperlipidemia 272.4 2D ECHO COMPLETE ADULT (TTE) W OR WO CONTR     ECHO TTE STRESS EXRCSE COMP W OR WO CONTR     LOOP MONITOR   4. Dizziness 780.4      Orders Placed This Encounter   ??? LOOP MONITOR, Clinic Performed     Standing Status: Future      Number of Occurrences:       Standing Expiration Date: 05/15/2014     Order Specific Question:  Reason for Exam:     Answer:  systolic murmur suspect A/S   ??? AMB POC EKG ROUTINE W/ 12 LEADS, INTER & REP     Order Specific Question:  Reason for Exam:     Answer:  htn   ??? 2D ECHO COMPLETE ADULT (TTE) W OR WO CONTR     Standing Status: Future      Number of Occurrences:       Standing Expiration Date: 05/15/2014     Order Specific Question:  Reason for Exam:     Answer:  systolic murmur suspect A/S     Order Specific Question:  Contrast Enhancement (Bubble Study, Definity, Optison) may be used if criteria listed in established evidence-based protocol has been identified.     Answer:  Yes   ??? ECHO TTE STRESS EXRCSE COMP W OR WO CONTR     Standing Status: Future      Number of Occurrences:        Standing Expiration Date: 05/15/2014     Order Specific Question:  Reason for Exam:     Answer:  systolic murmur suspect A/S     Order Specific Question:  Contrast Enhancement (Bubble Study, Definity, Optison) may be used if criteria listed in established evidence-based protocol has been identified.     Answer:  Yes       PLAN:    Patient presents with a significant episode of dizziness, nausea, vomiting diaphoresis and fatigue. Negative  acute ER work up. Ekg finds sinus with frequent PAC's, CAD risk factors of HTN, DM and high cholesterol. Systolic murmur on exam.  I will evaluate with ECHO Stress echo and place event monitor. Follow up when testing complete.     Berline Chough, NP        [facility]    11/12/2013         Agree with note as outlined by  NP. I confirm findings in history and physical exam. No additional findings noted. Agree with plan as outlined above.     [id number] Ramond Dial, MD

## 2013-11-12 NOTE — Progress Notes (Signed)
C/O - Edema

## 2013-11-28 NOTE — Progress Notes (Signed)
Quick Note:        Loop recorder shows Sinus tachycardia, frequent PAC's. F/u after all tests. thx.    ______

## 2013-11-29 NOTE — Telephone Encounter (Signed)
Pt informed of monitor results.  appt made for 9-8 to discuss all test results.

## 2013-11-29 NOTE — Telephone Encounter (Signed)
-----   Message from [place] Beckie Saltsavindra V, MD sent at 11/28/2013  3:36 PM EDT -----  Loop recorder shows Sinus tachycardia, frequent PAC's.  F/u after all tests. thx.

## 2013-11-29 NOTE — Telephone Encounter (Signed)
Left message for pt to return my call for monitor results.

## 2013-12-19 DIAGNOSIS — R002 Palpitations: Secondary | ICD-10-CM | POA: Insufficient documentation

## 2013-12-19 NOTE — Progress Notes (Signed)
Chief Complaint   Patient presents with   ??? Other     results/review,no cardiac symptoms

## 2013-12-19 NOTE — Progress Notes (Signed)
NAME:  Walter Molina   DOB:   1940/08/15   MRN:   [id number]   PCP:  Carylon Perches, MD    ??????    Subjective:     The patient is a 73 y.o. year old male  who returns for a routine follow-up. Since the last visit, patient reports no change in exercise tolerance, chest pain, edema, medication intolerance, palpitations, shortness of breath, PND/orthopnea wheezing, sputum, syncope, dizziness or light headedness. Doing well.    Past Medical History   Diagnosis Date   ??? Hypertension    ??? GERD (gastroesophageal reflux disease)    ??? Arthritis      knees   ??? Other ill-defined conditions(799.89)      diverticular disease/colon   ??? Other and unspecified hyperlipidemia    ??? Type II or unspecified type diabetes mellitus without mention of complication, not stated as uncontrolled ([facility])    ??? Seasonal allergic rhinitis         ICD-9-CM ICD-10-CM    1. Essential hypertension 401.9 I10    2. Other and unspecified hyperlipidemia 272.4 E78.5    3. Palpitations 785.1 R00.2       History   Substance Use Topics   ??? Smoking status: Never Smoker    ??? Smokeless tobacco: Not on file   ??? Alcohol Use: Yes      Comment: very rarely may have a beer or glass of wine      Family History   Problem Relation Age of Onset   ??? Diabetes Father    ??? Stroke Father 60   ??? Diabetes Other      cousin   ??? Heart Disease Neg Hx         Review of Systems  General: Pt denies excessive weight gain or loss. Pt is able to conduct ADL's  HEENT: Denies blurred vision, headaches, epistaxis and difficulty swallowing.  Respiratory: Denies shortness of breath, DOE, wheezing or stridor.  Cardiovascular: Denies precordial pain, palpitations, edema or PND  Gastrointestinal: Denies poor appetite, indigestion, abdominal pain or blood in stool  Musculoskeletal: Denies pain or swelling from muscles or joints  Neurologic: Denies tremor, paresthesias, or sensory motor disturbance  Skin: Denies rash, itching or texture change.    Objective:        Filed Vitals:    12/19/13 1517 12/19/13 1527   BP: 130/70 132/70   Pulse:  62   Resp:  14   Height:  (1.803 m)    Weight: 222 lb 14.4 oz (101.107 kg)     Body mass index is 31.1 kg/(m^2).      General??PE  Mental Status??- Alert. General Appearance??- Not in acute distress.    Chest and Lung Exam??  Inspection:??Accessory muscles??- No use of accessory muscles in breathing.  Auscultation:??  Breath sounds:??- Normal.    Cardiovascular??  Inspection:??Jugular vein??- Bilateral??- Inspection Normal.  Palpation/Percussion:??  Apical Impulse:??- Normal.  Auscultation:??Rhythm??- Regular. Heart Sounds??- S1 WNL and S2 WNL. No S3 or S4.  Murmurs & Other Heart Sounds:??Auscultation of the heart reveals - No Murmurs.    Peripheral Vascular??  Upper Extremity:??Inspection??- Bilateral??- No Cyanotic nailbeds or Digital clubbing.  Lower Extremity:??  Palpation:??Edema??- Bilateral??- No edema.        Data Review:     EKG -EKG: normal EKG, normal sinus rhythm, unchanged from previous tracings.    Medications reviewed  Current Outpatient Prescriptions   Medication Sig   ??? losartan-hydrochlorothiazide (HYZAAR) 50-12.5 mg per tablet TAKE 1  TABLET BY MOUTH EVERYDAY   ??? ONETOUCH ULTRASOFT LANCETS misc TEST ONCE DAILY. DX 250.00   ??? metFORMIN ER (GLUCOPHAGE XR) 500 mg tablet Take 1 tab with dinner x 1 week.  If tolerating, increase to 2 tabs at dinner   ??? ONETOUCH ULTRA TEST strip Test up to once daily.  Dx 250.00   ??? simvastatin (ZOCOR) 20 mg tablet TAKE 1 TABLET BY MOUTH NIGHTLY   ??? omeprazole (PRILOSEC) 20 mg capsule Take 20 mg by mouth daily.   ??? naproxen sodium (ALEVE) 220 mg tablet Take 220 mg by mouth as needed.   ??? meclizine (ANTIVERT) 25 mg tablet Take 1 Tab by mouth three (3) times daily as needed for Dizziness.     No current facility-administered medications for this visit.         Assessment:       ICD-9-CM ICD-10-CM    1. Essential hypertension 401.9 I10    2. Other and unspecified hyperlipidemia 272.4 E78.5     3. Palpitations 785.1 R00.2         Plan:     Patient presents doing well and stable from cardiac standpoint. Stress test, echo normal. Loop monitor showed sinus tachycardia, PAC's. Continue current care and follow up in as needed. Patient and wife reassured.     [id number] Beckie Salts, MD

## 2014-01-02 LAB — METABOLIC PANEL, COMPREHENSIVE
A-G Ratio: 2.4 (ref 1.1–2.5)
ALT (SGPT): 28 IU/L (ref 0–44)
AST (SGOT): 15 IU/L (ref 0–40)
Albumin: 4.4 g/dL (ref 3.5–4.8)
Alk. phosphatase: 71 IU/L (ref 39–117)
BUN/Creatinine ratio: 17 (ref 10–22)
BUN: 17 mg/dL (ref 8–27)
Bilirubin, total: 0.6 mg/dL (ref 0.0–1.2)
CO2: 22 mmol/L (ref 18–29)
Calcium: 9.2 mg/dL (ref 8.6–10.2)
Chloride: 102 mmol/L (ref 97–108)
Creatinine: 1.03 mg/dL (ref 0.76–1.27)
GFR est AA: 82 mL/min/{1.73_m2} (ref >59)
GFR est non-AA: 71 mL/min/{1.73_m2} (ref >59)
GLOBULIN, TOTAL: 1.8 g/dL (ref 1.5–4.5)
Glucose: 174 mg/dL — ABNORMAL HIGH (ref 65–99)
Potassium: 4.1 mmol/L (ref 3.5–5.2)
Protein, total: 6.2 g/dL (ref 6.0–8.5)
Sodium: 142 mmol/L (ref 134–144)

## 2014-01-02 LAB — LIPID PANEL
Cholesterol, total: 150 mg/dL (ref 100–199)
HDL Cholesterol: 38 mg/dL — ABNORMAL LOW (ref >39)
LDL, calculated: 87 mg/dL (ref 0–99)
Triglyceride: 127 mg/dL (ref 0–149)
VLDL, calculated: 25 mg/dL (ref 5–40)

## 2014-01-02 LAB — HEMOGLOBIN A1C WITH EAG: Hemoglobin A1c: 6.6 % — ABNORMAL HIGH (ref 4.8–5.6)

## 2014-01-02 LAB — CVD REPORT

## 2014-01-06 NOTE — Progress Notes (Signed)
Quick Note:        Reviewed at [facility] 01/06/14    ______

## 2014-01-06 NOTE — Patient Instructions (Signed)
1) Your Hemoglobin A1c (3 month test of blood sugar) is back to 6.6% which is at goal under 7%.  Keep taking 2 tabs of metformin every night.    2) Your blood pressure is excellent.    3) Your cholesterol has come down with weight loss and better A1c.    4) I will mail you a lab slip about 3-4 weeks before your next visit to have your labs repeated 3-4 days prior to your next visit.  You won't need to fast for your labs before your next visit.

## 2014-01-06 NOTE — Progress Notes (Signed)
Chief Complaint   Patient presents with   ??? Diabetes     [facility]p is [facility] in [place] and pharmacy confirmed   ??? Other     pt had eye exam in aug 2015     History of Present Illness: Walter Molina is a 73 y.o. male here for follow up of diabetes.  Weight down 6 lbs since last visit in 5/15.  Has been taking 2 tabs of metformin together after dinner and for a short period in the summer, was taking 3 tabs together and this may have been part of the reason he had an episode of dizziness in the beginning of August prompting an ER visit. Saw Dr. Colonel Bald and had a normal stress ECHO and a loop monitor that showed some sinus tachycardia with PACs and no further treatment was needed.  He decreased back to 2 tabs daily of metformin and hasn't had any further issues with dizziness.  Fasting sugars are in the 140-150s and can see readings in the 120s after dinner.  Not currently doing any exercise.  Is due to have left knee replacement in November but can't recall who he'll be seeing for this.  Has been doing more manual labor.  Had an eye exam in August and no retinopathy.      Current Outpatient Prescriptions   Medication Sig   ??? losartan-hydrochlorothiazide (HYZAAR) 50-12.5 mg per tablet TAKE 1 TABLET BY MOUTH EVERYDAY   ??? ONETOUCH ULTRASOFT LANCETS misc TEST ONCE DAILY. DX 250.00   ??? metFORMIN ER (GLUCOPHAGE XR) 500 mg tablet Take 1 tab with dinner x 1 week.  If tolerating, increase to 2 tabs at dinner   ??? ONETOUCH ULTRA TEST strip Test up to once daily.  Dx 250.00   ??? simvastatin (ZOCOR) 20 mg tablet TAKE 1 TABLET BY MOUTH NIGHTLY   ??? omeprazole (PRILOSEC) 20 mg capsule Take 20 mg by mouth daily.   ??? naproxen sodium (ALEVE) 220 mg tablet Take 220 mg by mouth as needed.     No current facility-administered medications for this visit.     Allergies   Allergen Reactions   ??? Penicillins Rash     Review of Systems:  - Eyes: no blurry vision or double vision  - Cardiovascular: no chest pain   - Respiratory: no shortness of breath  - Musculoskeletal: no myalgias  - Neurological: no numbness/tingling in extremities    Physical Examination:  - Blood pressure 110/57, pulse 54, height '5\' 11"'  (1.803 m), weight 222 lb 9.6 oz (100.971 kg).  - General: pleasant, no distress, good eye contact   - Neck: no carotid bruits  - Cardiovascular: regular, normal rate, nl s1 and s2, no m/r/g,    - Respiratory: clear bilaterally  - Integumentary: no edema,   - Psychiatric: normal mood and affect    Data Reviewed:   Component      Latest Ref Rng 01/01/2014 01/01/2014 01/01/2014           8:03 AM  8:03 AM  8:03 AM   Glucose      65 - 99 mg/dL   174 (H)   BUN      8 - 27 mg/dL   17   Creatinine      0.76 - 1.27 mg/dL   1.03   GFR est non-AA      >59 mL/min/1.73   71   GFR est AA      >59 mL/min/1.73   82  BUN/Creatinine ratio      10 - 22   17   Sodium      134 - 144 mmol/L   142   Potassium      3.5 - 5.2 mmol/L   4.1   Chloride      97 - 108 mmol/L   102   CO2      18 - 29 mmol/L   22   Calcium      8.6 - 10.2 mg/dL   9.2   Protein, total      6.0 - 8.5 g/dL   6.2   Albumin      3.5 - 4.8 g/dL   4.4   GLOBULIN, TOTAL      1.5 - 4.5 g/dL   1.8   A-G Ratio      1.1 - 2.5   2.4   Bilirubin, total      0.0 - 1.2 mg/dL   0.6   Alk. phosphatase      39 - 117 IU/L   71   AST      0 - 40 IU/L   15   ALT      0 - 44 IU/L   28   Cholesterol, total      100 - 199 mg/dL 150     Triglyceride      0 - 149 mg/dL 127     HDL Cholesterol      >39 mg/dL 38 (L)     VLDL, calculated      5 - 40 mg/dL 25     LDL, calculated      0 - 99 mg/dL 87     Hemoglobin A1c, (calculated)      4.8 - 5.6 %  6.6 (H)        Assessment/Plan:     1. DM w/o complication type II (469.62) his most recent Hgb A1c was 6.6% in 9/15 down from 7.9% in 5/15 up from 6.7% in 11/14 down from 7% in 6/14 up from 6.7% in 12/13 down from 6.9% in July up from 6.4% in Jan 2013 up from 6.1% in September down from 6.4% in April up from 6.1% in January  2012 down from 7.1% in September 2011.  A1c back to goal with starting metformin in 5/15 so won't make any changes at this time.  - cont metformin ER 500 mg 2 tabs at dinner  - check bs 4 times per week  - foot exam done 11/14  - optho UTD 8/15--will obtain report  - microalbumin nl 5/15  - check Hgb A1c and bmp prior to next visit     2. Hypertension (401.9AJ) his BP was at goal < 140/90  - cont hyzaar 50/12.5 mg daily     3. 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  LDL 79 in September 2011 and 86 in January 2012 and 83 in 9/12 and 85 in 7/13 and 84 in 12/13 and 88 in 6/14.  LDL 84 in 5/15 but TG up to 184 due to wt gain and higher A1c.  LDL 87 and TG 127 in 9/15 with wt loss and lower A1c.  - cont simva 20 mg daily  - check lipids prior to next visit       Patient Instructions   1) Your Hemoglobin A1c (3 month test of blood sugar) is back to 6.6% which is at goal  under 7%.  Keep taking 2 tabs of metformin every night.    2) Your blood pressure is excellent.    3) Your cholesterol has come down with weight loss and better A1c.    4) I will mail you a lab slip about 3-4 weeks before your next visit to have your labs repeated 3-4 days prior to your next visit.  You won't need to fast for your labs before your next visit.        Follow-up Disposition:  Return in about 5 months (around 06/08/2014).    Copy sent to:  [facility] [phone]

## 2014-02-03 ENCOUNTER — Inpatient Hospital Stay: Admit: 2014-02-03 | Payer: MEDICARE | Primary: Family Medicine

## 2014-02-03 DIAGNOSIS — Z01818 Encounter for other preprocedural examination: Secondary | ICD-10-CM

## 2014-02-03 LAB — URINALYSIS W/ REFLEX CULTURE
Bacteria: NEGATIVE /hpf
Bilirubin: NEGATIVE
Blood: NEGATIVE
Glucose: NEGATIVE mg/dL
Ketone: NEGATIVE mg/dL
Leukocyte Esterase: NEGATIVE
Nitrites: NEGATIVE
Protein: NEGATIVE mg/dL
Specific gravity: 1.015 (ref 1.003–1.030)
Urobilinogen: 0.2 EU/dL (ref 0.2–1.0)
pH (UA): 5 (ref 5.0–8.0)

## 2014-02-03 LAB — METABOLIC PANEL, COMPREHENSIVE
A-G Ratio: 1.2 (ref 1.1–2.2)
ALT (SGPT): 44 U/L (ref 12–78)
AST (SGOT): 19 U/L (ref 15–37)
Albumin: 3.6 g/dL (ref 3.5–5.0)
Alk. phosphatase: 87 U/L (ref 45–117)
Anion gap: 8 mmol/L (ref 5–15)
BUN/Creatinine ratio: 16 (ref 12–20)
BUN: 15 MG/DL (ref 6–20)
Bilirubin, total: 0.4 MG/DL (ref 0.2–1.0)
CO2: 25 mmol/L (ref 21–32)
Calcium: 8.7 MG/DL (ref 8.5–10.1)
Chloride: 105 mmol/L (ref 97–108)
Creatinine: 0.95 MG/DL (ref 0.70–1.30)
GFR est AA: >60 mL/min/{1.73_m2} (ref >60)
GFR est non-AA: >60 mL/min/{1.73_m2} (ref >60)
Globulin: 3.1 g/dL (ref 2.0–4.0)
Glucose: 239 mg/dL — ABNORMAL HIGH (ref 65–100)
Potassium: 4 mmol/L (ref 3.5–5.1)
Protein, total: 6.7 g/dL (ref 6.4–8.2)
Sodium: 138 mmol/L (ref 136–145)

## 2014-02-03 LAB — HEMOGLOBIN A1C WITH EAG
Est. average glucose: 140 mg/dL
Hemoglobin A1c: 6.5 % — ABNORMAL HIGH (ref 4.2–6.3)

## 2014-02-03 LAB — CBC W/O DIFF
HCT: 42.4 % (ref 36.6–50.3)
HGB: 14.2 g/dL (ref 12.1–17.0)
MCH: 31.3 PG (ref 26.0–34.0)
MCHC: 33.5 g/dL (ref 30.0–36.5)
MCV: 93.4 FL (ref 80.0–99.0)
PLATELET: 152 10*3/uL (ref 150–400)
RBC: 4.54 M/uL (ref 4.10–5.70)
RDW: 13.4 % (ref 11.5–14.5)
WBC: 5.7 10*3/uL (ref 4.1–11.1)

## 2014-02-03 LAB — PROTHROMBIN TIME + INR
INR: 1 (ref 0.9–1.1)
Prothrombin time: 10.3 s (ref 9.0–11.1)

## 2014-02-03 NOTE — Progress Notes (Signed)
[facility]  Preoperative Instructions        Surgery Date Tuesday Nov 10         Time of Arrival 630am    1. On the day of your surgery, please report to the Surgical Services Registration Desk and sign in at your designated time. The Surgery Center is located to the right of the Emergency Room.     2. You must have someone with you to drive you home. You should not drive a car for 24 hours following surgery. Please make arrangements for a friend or family member to stay with you for the first 24 hours after your surgery.    3. Do not have anything to eat or drink (including water, gum, mints, coffee, juice) after midnight Monday Nov 9              . This may not apply to medications prescribed by your physician.  Please note special instructions, if applicable.  If you are currently taking Plavix, Coumadin, or other blood-thinning agents, contact your surgeon for instructions.    4. We recommend you do not drink any alcoholic beverages for 24 hours before and after your surgery.    5. Have a list of all current medications, including vitamins, herbal supplements and any other over the counter medications. Stop all Aspirin and non-steroidal anti-inflammatory drugs (I.e. Advil, Aleve), as directed by your surgeon's office. Stop all vitamins and herbal supplements seven days prior to your surgery.    6. Wear comfortable clothes.  Wear glasses instead of contacts.  Do not bring any money or jewelry. Please bring picture ID, insurance card, and any prearranged co-payment or hospital payment.  Do not wear make-up, particularly mascara the morning of your surgery.  Do not wear nail polish, particularly if you are having foot /hand surgery.  Wear your hair loose or down, no ponytails, buns, bobby pins or clips.  All body piercings must be removed.  Please shower with antibacterial soap -gold/safeguard brand- for three consecutive days before and on the  morning of surgery, but do not apply any lotions, powders or deodorants after the shower on the day of surgery. Please use a fresh towels after each shower. Please sleep in clean clothes and change bed linens the night before surgery.  Please do not shave for 48 hours prior to surgery. Shaving of the face is acceptable.    7. You should understand that if you do not follow these instructions your surgery may be cancelled.  If your physical condition changes (I.e. fever, cold or flu) please contact your surgeon as soon as possible.    8. It is important that you be on time.  If a situation occurs where you may be late, please call [phone] (OR Holding Area).    9. If you have any questions and or problems, please call [phone] (Pre-admission Testing).    10. Your surgery time may be subject to change.  You will receive a phone call the evening prior if your time changes.    11.  If having outpatient surgery, you must have someone to drive you here, stay with you during the duration of your stay, and to drive you home at time of discharge.        Special Instructions:  Bring advanced directive information day of surgery    MEDICATIONS TO TAKE THE MORNING OF SURGERY WITH A SIP OF WATER:  Pantoprazole     I understand a  pre-operative phone call will be made to verify my surgery time.  In the event that I am not available, I give permission for a message to be left on my answering service and/or with another person?  Yes [phone]          ___________________      __________   _________    (Signature of Patient)             (Witness)                (Date and Time)

## 2014-02-03 NOTE — Progress Notes (Signed)
Spoke to [employer] Stationers[employer] in Dr Patrecia PaceWills office regarding patient allergic to PCN and Ancef ordered. Stated to order Vancomycin and a new order sheet will be faxed.

## 2014-02-03 NOTE — Progress Notes (Signed)
[facility]  Physical Therapy Pre-surgery evaluation  [address]  [place], [place] [address]    physical Therapy pre TKR surgery EVALUATION  Patient: Walter Molina (73 y.o. 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     Discussed anticipated disposition to home with home health, spouse, with possible discharge within a 1 to 2 day time frame post-surgery. Patient in agreement. Expect patient to improve as expected given limited current impairments, LEFS scoring.    Marland Kitchen    GOALS: (Goals have been discussed and agreed upon with patient.)  DISCHARGE GOALS: Time Frame: 1 DAY  1. Patient will demonstrate increased strength, range of motion, and pain control via a home exercise program in order to minimize functional deficits in preparation for their upcoming surgery. This will be achieved by using education, demonstration and through the use of an informational handout including a home exercise program.  REHABILITATION POTENTIAL FOR STATED GOALS: Excellent     INTERVENTIONS PLANNED: (Benefits and precautions of physical therapy have been discussed with the patient.)  1. Home Exercise Program  TREATMENT PLAN EFFECTIVE DATES: 02/03/2014 TO 02/03/2014  FREQUENCY/DURATION: Patient to continue to perform home exercise program at least twice daily until his surgery.     SUBJECTIVE:   Patient stated ???I think I tore my rotator cuff.???    OBJECTIVE DATA SUMMARY:     Past Medical History   Diagnosis Date   ??? Hypertension    ??? GERD (gastroesophageal reflux disease)    ??? Arthritis      knees   ??? Other ill-defined conditions(799.89)      diverticular disease/colon   ??? Other and unspecified hyperlipidemia    ??? Seasonal allergic rhinitis     ??? Type II or unspecified type diabetes mellitus without mention of complication, not stated as uncontrolled (HCC)      borderline   ??? Ill-defined condition      high cholesterol     Past Surgical History   Procedure Laterality Date   ??? Pr abdomen surgery proc unlisted       colectomy/12 years ago   ??? Hx appendectomy     ??? Colonoscopy  10/10/2008         ??? Hx orthopaedic       bunionectomy b/l   ??? Hx orthopaedic  07/24/13     right 2nd toe had bone spur removed   ??? Hx cataract removal Bilateral 9/13   ??? Hx tonsillectomy       Prior Level of Function/Home Situation: Independent, working FT, lives with spouse who will care for him at discharge. He reports he has pain in his L shoulder 2/2 prior dislocation and possible RTC tear. He used to walk for exercise.    Home Situation  Home Environment: Private residence  # Steps to Enter: 4  Rails to Enter: Yes  Hand Rails : Bilateral  Wheelchair Ramp: No  One/Two Story Residence: One story  Living Alone: No  Support Systems: Games developer  Patient Expects to be Discharged to:: Private residence  Current DME Used/Available at Home: Glucometer, Other (comment) (built in Information systems manager)  Tub or Shower Type: Shower    ADLs (Current Functional Status):   Bathing/Showering:  Independent   Requires Assistance from Someone   Henry [employer]inSponge Bath Only   Ambulation:   Independent   Walk Indoors Only   Walk Company secretaryutdoors   Use Assistive Device   Use Wheelchair Only     Dressing:   Independent    Requires Assistance from Someone for:   Sock/Shoes   Pants   Everything   Household Activities:   Routine house and yard work   Light Housework Only   None       Critical Behavior:  Neurologic State: Alert  Orientation Level: Oriented X4  Cognition: Appropriate decision making  Safety/Judgement: Awareness of environment    Strength:    Strength: Within functional limits                    Tone & Sensation:   Tone: Normal              Sensation: Intact               Range Of Motion:   AROM: Within functional limits                       Coordination:  Coordination: Within functional limits    Functional Mobility:  Transfers:  Sit to Stand: Independent  Stand to Sit: Independent                       Balance:   Sitting: Intact, Without support  Standing: Intact, Without support (initially mildly unsteady )  Ambulation/Gait Training:  Distance (ft): 30 Feet (ft)     Ambulation - Level of Assistance: Independent     Gait Description (WDL): Exceptions to WDL                                       No concerns with exception of initially mildly unsteady gait that he improved and corrected   Therapeutic Exercises:   The patient was educated in, has demonstrated, and has received written instructions to complete for their home exercise program per total knee replacement protocol.      Functional Measure:  LEFS    G codes:  In compliance with CMS???s Claims Based Outcome Reporting, the following G-code set was chosen for this patient based on their primary functional limitation being treated:    The outcome measure chosen to determine the severity of the functional limitation was the LEFS with a score of 40/80 which was correlated with the impairment scale.    ? Mobility - Walking and Moving Around:    [phone] - CURRENT STATUS: CK - 40%-59% impaired, limited or restricted   [id number] - GOAL STATUS:  CK - 40%-59% impaired, limited or restricted   [id number] - D/C STATUS:  CK - 40%-59% impaired, limited or restricted     Pain:  Pain Scale 1: Numeric (0 - 10)  Pain Intensity 1: 0                Activity Tolerance:   WFL      COMMUNICATION/EDUCATION:   The patient was educated on:           Importance of post operative mobility to achieve their desired outcomes and restore biological function           The key post operative time frame to address ROM to prevent additional complications  The patient???s plan of care was discussed with:            The patient verbalized understanding of his plan in preparation  for their upcoming surgery           The patient's coach was present for this session           The patient reports that he/she does not have a coach identified at this time           The coach verbalized understanding of the education regarding the patient's upcoming surgery           Patient/family agree to work toward stated goals and plan of care.           Patient understands intent and goals of therapy, but is neutral about his/her participation.           Patient is unable to participate in goal setting and plan of care.    Thank you for this referral.  Horton ChinBrandon K Smith, PT   Time Calculation: 19 mins

## 2014-02-04 LAB — CULTURE, MRSA

## 2014-02-04 MED ORDER — SIMVASTATIN 20 MG TAB
20 mg | ORAL_TABLET | ORAL | Status: DC
Start: 2014-02-04 — End: 2015-01-27

## 2014-02-17 MED ORDER — EPINEPHRINE (PF) 1 MG/ML INJECTION
11 mg/mL ( mL) | Freq: Once | INTRAMUSCULAR | Status: AC
Start: 2014-02-17 — End: 2014-02-18
  Administered 2014-02-18: 14:00:00 via INTRAMUSCULAR

## 2014-02-17 MED FILL — SODIUM CHLORIDE 0.9 % IV: INTRAVENOUS | Qty: 37.9

## 2014-02-18 ENCOUNTER — Inpatient Hospital Stay
Admit: 2014-02-18 | Discharge: 2014-02-20 | Disposition: A | Discharge status: Home Health | Payer: MEDICARE | Attending: Orthopaedic Surgery | Admitting: Orthopaedic Surgery

## 2014-02-18 DIAGNOSIS — M179 Osteoarthritis of knee, unspecified: Secondary | ICD-10-CM

## 2014-02-18 DIAGNOSIS — M1710 Unilateral primary osteoarthritis, unspecified knee: Secondary | ICD-10-CM | POA: Insufficient documentation

## 2014-02-18 LAB — TYPE AND SCREEN
ABO/Rh: O POS
Antibody Screen: NEGATIVE

## 2014-02-18 LAB — GLUCOSE, POC
Glucose (POC): 194 mg/dL — ABNORMAL HIGH (ref 65–100)
Glucose (POC): 178 mg/dL — ABNORMAL HIGH (ref 65–100)
Glucose (POC): 179 mg/dL — ABNORMAL HIGH (ref 65–100)

## 2014-02-18 LAB — TYPE & SCREEN
ABO/Rh(D): O POS
Antibody screen: NEGATIVE

## 2014-02-18 MED ORDER — PROPOFOL 10 MG/ML IV EMUL
10 mg/mL | INTRAVENOUS | Status: DC | PRN
Start: 2014-02-18 — End: 2014-02-18
  Administered 2014-02-18: 13:00:00 via INTRAVENOUS

## 2014-02-18 MED ORDER — ROCURONIUM 10 MG/ML IV
10 mg/mL | INTRAVENOUS | Status: DC | PRN
Start: 2014-02-18 — End: 2014-02-18
  Administered 2014-02-18 (×3): via INTRAVENOUS

## 2014-02-18 MED ORDER — EPHEDRINE SULFATE 50 MG/ML IJ SOLN
50 mg/mL | INTRAMUSCULAR | Status: DC | PRN
Start: 2014-02-18 — End: 2014-02-18

## 2014-02-18 MED ORDER — FENTANYL CITRATE (PF) 50 MCG/ML IJ SOLN
50 mcg/mL | INTRAMUSCULAR | Status: DC | PRN
Start: 2014-02-18 — End: 2014-02-18
  Administered 2014-02-18 (×3): via INTRAVENOUS

## 2014-02-18 MED ORDER — TRANEXAMIC ACID 1,000 MG/10 ML (100 MG/ML) IV
1000 mg/10 mL (100 mg/mL) | INTRAVENOUS | Status: AC
Start: 2014-02-18 — End: ?

## 2014-02-18 MED ORDER — POLYETHYLENE GLYCOL 3350 17 GRAM (100 %) ORAL POWDER PACKET
17 gram | Freq: Every day | ORAL | Status: DC
Start: 2014-02-18 — End: 2014-02-20
  Administered 2014-02-19 – 2014-02-20 (×3): via ORAL

## 2014-02-18 MED ORDER — ONDANSETRON (PF) 4 MG/2 ML INJECTION
4 mg/2 mL | INTRAMUSCULAR | Status: DC | PRN
Start: 2014-02-18 — End: 2014-02-18

## 2014-02-18 MED ORDER — HYDROMORPHONE (PF) 1 MG/ML IJ SOLN
1 mg/mL | INTRAMUSCULAR | Status: DC | PRN
Start: 2014-02-18 — End: 2014-02-18

## 2014-02-18 MED ORDER — METFORMIN SR 500 MG 24 HR TABLET
500 mg | Freq: Every day | ORAL | Status: DC
Start: 2014-02-18 — End: 2014-02-19
  Administered 2014-02-19: 01:00:00 via ORAL

## 2014-02-18 MED ORDER — OXYCODONE 5 MG TAB
5 mg | ORAL | Status: DC | PRN
Start: 2014-02-18 — End: 2014-02-18

## 2014-02-18 MED ORDER — ROCURONIUM 10 MG/ML IV
10 mg/mL | INTRAVENOUS | Status: AC
Start: 2014-02-18 — End: ?

## 2014-02-18 MED ORDER — LIDOCAINE (PF) 20 MG/ML (2 %) IJ SOLN
20 mg/mL (2 %) | INTRAMUSCULAR | Status: AC
Start: 2014-02-18 — End: ?

## 2014-02-18 MED ORDER — SODIUM CHLORIDE 0.9 % IJ SYRG
INTRAMUSCULAR | Status: DC | PRN
Start: 2014-02-18 — End: 2014-02-20

## 2014-02-18 MED ORDER — CELECOXIB 100 MG CAP
100 mg | Freq: Once | ORAL | Status: AC
Start: 2014-02-18 — End: 2014-02-18
  Administered 2014-02-18: 12:00:00 via ORAL

## 2014-02-18 MED ORDER — VANCOMYCIN 10 GRAM IV SOLR
10 gram | Freq: Once | INTRAVENOUS | Status: AC
Start: 2014-02-18 — End: 2014-02-18
  Administered 2014-02-19: 01:00:00 via INTRAVENOUS

## 2014-02-18 MED ORDER — ACETAMINOPHEN 325 MG TABLET
325 mg | Freq: Once | ORAL | Status: AC
Start: 2014-02-18 — End: 2014-02-18
  Administered 2014-02-18: 12:00:00 via ORAL

## 2014-02-18 MED ORDER — CELECOXIB 100 MG CAP
100 mg | Freq: Two times a day (BID) | ORAL | Status: DC
Start: 2014-02-18 — End: 2014-02-20
  Administered 2014-02-19 – 2014-02-20 (×4): via ORAL

## 2014-02-18 MED ORDER — PREGABALIN 75 MG CAP
75 mg | Freq: Once | ORAL | Status: AC
Start: 2014-02-18 — End: 2014-02-18
  Administered 2014-02-18: 12:00:00 via ORAL

## 2014-02-18 MED ORDER — NALOXONE 0.4 MG/ML INJECTION
0.4 mg/mL | INTRAMUSCULAR | Status: DC | PRN
Start: 2014-02-18 — End: 2014-02-20

## 2014-02-18 MED ORDER — PROPOFOL 10 MG/ML IV EMUL
10 mg/mL | INTRAVENOUS | Status: AC
Start: 2014-02-18 — End: ?

## 2014-02-18 MED ORDER — LEVOFLOXACIN IN D5W 500 MG/100 ML IV PIGGY BACK
500 mg/100 mL | INTRAVENOUS | Status: AC
Start: 2014-02-18 — End: ?

## 2014-02-18 MED ORDER — SODIUM CHLORIDE 0.9 % IV PIGGY BACK
1000 mg | Freq: Two times a day (BID) | INTRAVENOUS | Status: DC
Start: 2014-02-18 — End: 2014-02-18

## 2014-02-18 MED ORDER — NEOSTIGMINE METHYLSULFATE 1 MG/ML INJECTION
1 mg/mL | INTRAMUSCULAR | Status: DC | PRN
Start: 2014-02-18 — End: 2014-02-18
  Administered 2014-02-18: 14:00:00 via INTRAVENOUS

## 2014-02-18 MED ORDER — MIDAZOLAM 1 MG/ML IJ SOLN
1 mg/mL | INTRAMUSCULAR | Status: AC
Start: 2014-02-18 — End: ?

## 2014-02-18 MED ORDER — BACITRACIN 50,000 UNIT IM
50000 unit | INTRAMUSCULAR | Status: AC
Start: 2014-02-18 — End: ?

## 2014-02-18 MED ORDER — VALSARTAN 80 MG TAB
80 mg | Freq: Every day | ORAL | Status: DC
Start: 2014-02-18 — End: 2014-02-20
  Administered 2014-02-19 – 2014-02-20 (×3): via ORAL

## 2014-02-18 MED ORDER — LACTATED RINGERS IV
INTRAVENOUS | Status: DC
Start: 2014-02-18 — End: 2014-02-18
  Administered 2014-02-18: 15:00:00 via INTRAVENOUS

## 2014-02-18 MED ORDER — PREGABALIN 75 MG CAP
75 mg | ORAL | Status: AC
Start: 2014-02-18 — End: ?

## 2014-02-18 MED ORDER — OXYCODONE 5 MG TAB
5 mg | ORAL | Status: DC | PRN
Start: 2014-02-18 — End: 2014-02-20
  Administered 2014-02-18 – 2014-02-20 (×14): via ORAL

## 2014-02-18 MED ORDER — .PHARMACY TO SUBSTITUTE PER PROTOCOL
Status: DC | PRN
Start: 2014-02-18 — End: 2014-02-18

## 2014-02-18 MED ORDER — MIDAZOLAM 1 MG/ML IJ SOLN
1 mg/mL | INTRAMUSCULAR | Status: DC | PRN
Start: 2014-02-18 — End: 2014-02-18

## 2014-02-18 MED ORDER — GLYCOPYRROLATE 0.2 MG/ML IJ SOLN
0.2 mg/mL | INTRAMUSCULAR | Status: AC
Start: 2014-02-18 — End: ?

## 2014-02-18 MED ORDER — ONDANSETRON (PF) 4 MG/2 ML INJECTION
4 mg/2 mL | INTRAMUSCULAR | Status: DC | PRN
Start: 2014-02-18 — End: 2014-02-20

## 2014-02-18 MED ORDER — BISACODYL 10 MG RECTAL SUPPOSITORY
10 mg | Freq: Every day | RECTAL | Status: DC | PRN
Start: 2014-02-18 — End: 2014-02-20

## 2014-02-18 MED ORDER — VANCOMYCIN IN 0.9% SODIUM CHLORIDE 1.5 G/500 ML IV
1.5 g/500 mL | Freq: Once | INTRAVENOUS | Status: AC
Start: 2014-02-18 — End: 2014-02-18
  Administered 2014-02-18: 13:00:00 via INTRAVENOUS

## 2014-02-18 MED ORDER — PNEUMOCOCCAL 23-VALPS VACCINE 25 MCG/0.5 ML INJECTION
25 mcg/0.5 mL | INTRAMUSCULAR | Status: AC
Start: 2014-02-18 — End: 2014-02-19
  Administered 2014-02-19: 16:00:00 via INTRAMUSCULAR

## 2014-02-18 MED ORDER — ACETAMINOPHEN 325 MG TABLET
325 mg | Freq: Four times a day (QID) | ORAL | Status: DC
Start: 2014-02-18 — End: 2014-02-20
  Administered 2014-02-18 – 2014-02-20 (×9): via ORAL

## 2014-02-18 MED ORDER — OXYCODONE ER 10 MG TABLET,CRUSH RESISTANT,EXTENDED RELEASE 12 HR
10 mg | Freq: Once | ORAL | Status: AC
Start: 2014-02-18 — End: 2014-02-18
  Administered 2014-02-18: 12:00:00 via ORAL

## 2014-02-18 MED ORDER — LACTATED RINGERS IV
INTRAVENOUS | Status: DC
Start: 2014-02-18 — End: 2014-02-18
  Administered 2014-02-18 (×2): via INTRAVENOUS

## 2014-02-18 MED ORDER — SODIUM CHLORIDE 0.9 % IJ SYRG
Freq: Three times a day (TID) | INTRAMUSCULAR | Status: DC
Start: 2014-02-18 — End: 2014-02-20
  Administered 2014-02-19 – 2014-02-20 (×3): via INTRAVENOUS

## 2014-02-18 MED ORDER — ACETAMINOPHEN 325 MG TABLET
325 mg | ORAL | Status: AC
Start: 2014-02-18 — End: ?

## 2014-02-18 MED ORDER — MAGNESIUM HYDROXIDE 400 MG/5 ML ORAL SUSP
400 mg/5 mL | Freq: Once | ORAL | Status: AC
Start: 2014-02-18 — End: 2014-02-19
  Administered 2014-02-20 (×2): via ORAL

## 2014-02-18 MED ORDER — SODIUM CHLORIDE 0.9 % IV
INTRAVENOUS | Status: AC
Start: 2014-02-18 — End: 2014-02-19
  Administered 2014-02-18 – 2014-02-19 (×3): via INTRAVENOUS

## 2014-02-18 MED ORDER — SODIUM CHLORIDE 0.9 % IJ SYRG
INTRAMUSCULAR | Status: AC
Start: 2014-02-18 — End: ?

## 2014-02-18 MED ORDER — LEVOFLOXACIN IN D5W 500 MG/100 ML IV PIGGY BACK
500 mg/100 mL | Freq: Once | INTRAVENOUS | Status: AC
Start: 2014-02-18 — End: 2014-02-18
  Administered 2014-02-18: 13:00:00 via INTRAVENOUS

## 2014-02-18 MED ORDER — ASPIRIN 325 MG TAB
325 mg | Freq: Two times a day (BID) | ORAL | Status: DC
Start: 2014-02-18 — End: 2014-02-20
  Administered 2014-02-19 – 2014-02-20 (×5): via ORAL

## 2014-02-18 MED ORDER — GLYCOPYRROLATE 0.2 MG/ML IJ SOLN
0.2 mg/mL | INTRAMUSCULAR | Status: DC | PRN
Start: 2014-02-18 — End: 2014-02-18
  Administered 2014-02-18: 14:00:00 via INTRAVENOUS

## 2014-02-18 MED ORDER — ONDANSETRON (PF) 4 MG/2 ML INJECTION
4 mg/2 mL | INTRAMUSCULAR | Status: DC | PRN
Start: 2014-02-18 — End: 2014-02-18
  Administered 2014-02-18: 13:00:00 via INTRAVENOUS

## 2014-02-18 MED ORDER — NEOSTIGMINE METHYLSULFATE 1 MG/ML INJECTION
1 mg/mL | INTRAMUSCULAR | Status: AC
Start: 2014-02-18 — End: ?

## 2014-02-18 MED ORDER — OXYCODONE 5 MG TAB
5 mg | ORAL | Status: DC | PRN
Start: 2014-02-18 — End: 2014-02-20

## 2014-02-18 MED ORDER — SENNOSIDES-DOCUSATE SODIUM 8.6 MG-50 MG TAB
Freq: Two times a day (BID) | ORAL | Status: DC
Start: 2014-02-18 — End: 2014-02-20
  Administered 2014-02-18 – 2014-02-20 (×5): via ORAL

## 2014-02-18 MED ORDER — SODIUM CHLORIDE 0.9 % IJ SYRG
INTRAMUSCULAR | Status: DC | PRN
Start: 2014-02-18 — End: 2014-02-18

## 2014-02-18 MED ORDER — TRANEXAMIC ACID 1,000 MG/10 ML (100 MG/ML) IV
1000 mg/10 mL (100 mg/mL) | Freq: Once | INTRAVENOUS | Status: AC
Start: 2014-02-18 — End: 2014-02-18
  Administered 2014-02-18: 16:00:00 via INTRAVENOUS

## 2014-02-18 MED ORDER — MORPHINE 2 MG/ML INJECTION
2 mg/mL | INTRAMUSCULAR | Status: AC | PRN
Start: 2014-02-18 — End: 2014-02-19

## 2014-02-18 MED ORDER — BACITRACIN 50,000 UNIT IM
50000 unit | INTRAMUSCULAR | Status: DC | PRN
Start: 2014-02-18 — End: 2014-02-18
  Administered 2014-02-18: 13:00:00

## 2014-02-18 MED ORDER — CEFAZOLIN 2 G IN 100 ML 0.9% NS
2 gram/100 mL | INTRAVENOUS | Status: AC
Start: 2014-02-18 — End: ?

## 2014-02-18 MED ORDER — MIDAZOLAM 1 MG/ML IJ SOLN
1 mg/mL | INTRAMUSCULAR | Status: DC | PRN
Start: 2014-02-18 — End: 2014-02-18
  Administered 2014-02-18: 13:00:00 via INTRAVENOUS

## 2014-02-18 MED ORDER — PRAVASTATIN 40 MG TAB
40 mg | Freq: Every evening | ORAL | Status: DC
Start: 2014-02-18 — End: 2014-02-20
  Administered 2014-02-19 – 2014-02-20 (×3): via ORAL

## 2014-02-18 MED ORDER — SODIUM CHLORIDE 0.9 % IRRIGATION SOLN
0.9 % | Status: DC | PRN
Start: 2014-02-18 — End: 2014-02-18
  Administered 2014-02-18: 13:00:00

## 2014-02-18 MED ORDER — DIPHENHYDRAMINE HCL 50 MG/ML IJ SOLN
50 mg/mL | INTRAMUSCULAR | Status: DC | PRN
Start: 2014-02-18 — End: 2014-02-18

## 2014-02-18 MED ORDER — LIDOCAINE (PF) 20 MG/ML (2 %) IJ SOLN
20 mg/mL (2 %) | INTRAMUSCULAR | Status: DC | PRN
Start: 2014-02-18 — End: 2014-02-18
  Administered 2014-02-18: 13:00:00 via INTRAVENOUS

## 2014-02-18 MED ORDER — KETOROLAC TROMETHAMINE 30 MG/ML INJECTION
30 mg/mL (1 mL) | Freq: Four times a day (QID) | INTRAMUSCULAR | Status: AC
Start: 2014-02-18 — End: 2014-02-19
  Administered 2014-02-18 – 2014-02-19 (×4): via INTRAVENOUS

## 2014-02-18 MED ORDER — FENTANYL CITRATE (PF) 50 MCG/ML IJ SOLN
50 mcg/mL | INTRAMUSCULAR | Status: DC | PRN
Start: 2014-02-18 — End: 2014-02-18

## 2014-02-18 MED ORDER — FENTANYL CITRATE (PF) 50 MCG/ML IJ SOLN
50 mcg/mL | INTRAMUSCULAR | Status: AC
Start: 2014-02-18 — End: ?

## 2014-02-18 MED ORDER — OXYCODONE ER 10 MG TABLET,CRUSH RESISTANT,EXTENDED RELEASE 12 HR
10 mg | ORAL | Status: AC
Start: 2014-02-18 — End: ?

## 2014-02-18 MED ORDER — MORPHINE 10 MG/ML INJ SOLUTION
10 mg/ml | INTRAMUSCULAR | Status: DC | PRN
Start: 2014-02-18 — End: 2014-02-18

## 2014-02-18 MED ORDER — VANCOMYCIN IN 0.9% SODIUM CHLORIDE 1.5 G/500 ML IV
1.5 g/500 mL | INTRAVENOUS | Status: AC
Start: 2014-02-18 — End: ?

## 2014-02-18 MED ORDER — PANTOPRAZOLE 40 MG TAB, DELAYED RELEASE
40 mg | Freq: Every day | ORAL | Status: DC
Start: 2014-02-18 — End: 2014-02-20
  Administered 2014-02-19 – 2014-02-20 (×3): via ORAL

## 2014-02-18 MED ORDER — CELECOXIB 100 MG CAP
100 mg | ORAL | Status: AC
Start: 2014-02-18 — End: ?

## 2014-02-18 MED ORDER — SODIUM CHLORIDE 0.9 % IV
1000 mg/10 mL (100 mg/mL) | INTRAVENOUS | Status: DC | PRN
Start: 2014-02-18 — End: 2014-02-18
  Administered 2014-02-18: 13:00:00 via INTRAVENOUS

## 2014-02-18 MED ORDER — MEPERIDINE (PF) 25 MG/ML INJ SOLUTION
25 mg/ml | Freq: Once | INTRAMUSCULAR | Status: DC
Start: 2014-02-18 — End: 2014-02-18
  Administered 2014-02-18: 15:00:00 via INTRAVENOUS

## 2014-02-18 MED FILL — OXYCONTIN 10 MG TABLET,CRUSH RESISTANT,EXTENDED RELEASE: 10 mg | ORAL | Qty: 1

## 2014-02-18 MED FILL — BACITRACIN 50,000 UNIT IM: 50000 unit | INTRAMUSCULAR | Qty: 50000

## 2014-02-18 MED FILL — VANCOMYCIN 10 GRAM IV SOLR: 10 gram | INTRAVENOUS | Qty: 1500

## 2014-02-18 MED FILL — LYRICA 75 MG CAPSULE: 75 mg | ORAL | Qty: 1

## 2014-02-18 MED FILL — GLYCOPYRROLATE 0.2 MG/ML IJ SOLN: 0.2 mg/mL | INTRAMUSCULAR | Qty: 3

## 2014-02-18 MED FILL — KETOROLAC TROMETHAMINE 30 MG/ML INJECTION: 30 mg/mL (1 mL) | INTRAMUSCULAR | Qty: 1

## 2014-02-18 MED FILL — LEVOFLOXACIN IN D5W 500 MG/100 ML IV PIGGY BACK: 500 mg/100 mL | INTRAVENOUS | Qty: 100

## 2014-02-18 MED FILL — NEOSTIGMINE METHYLSULFATE 1 MG/ML INJECTION: 1 mg/mL | INTRAMUSCULAR | Qty: 10

## 2014-02-18 MED FILL — ROCURONIUM 10 MG/ML IV: 10 mg/mL | INTRAVENOUS | Qty: 5

## 2014-02-18 MED FILL — LIDOCAINE (PF) 20 MG/ML (2 %) IJ SOLN: 20 mg/mL (2 %) | INTRAMUSCULAR | Qty: 5

## 2014-02-18 MED FILL — BACITRACIN 50,000 UNIT IM: 50000 unit | INTRAMUSCULAR | Qty: 100000

## 2014-02-18 MED FILL — TRANEXAMIC ACID 1,000 MG/10 ML (100 MG/ML) IV: 1000 mg/10 mL (100 mg/mL) | INTRAVENOUS | Qty: 10

## 2014-02-18 MED FILL — LACTATED RINGERS IV: INTRAVENOUS | Qty: 1000

## 2014-02-18 MED FILL — CEFAZOLIN 2 G IN 100 ML 0.9% NS: 2 gram/100 mL | INTRAVENOUS | Qty: 100

## 2014-02-18 MED FILL — CELEBREX 100 MG CAPSULE: 100 mg | ORAL | Qty: 2

## 2014-02-18 MED FILL — MAPAP (ACETAMINOPHEN) 325 MG TABLET: 325 mg | ORAL | Qty: 2

## 2014-02-18 MED FILL — DOK PLUS 8.6 MG-50 MG TABLET: ORAL | Qty: 1

## 2014-02-18 MED FILL — OXYCODONE 5 MG TAB: 5 mg | ORAL | Qty: 2

## 2014-02-18 MED FILL — BD POSIFLUSH NORMAL SALINE 0.9 % INJECTION SYRINGE: INTRAMUSCULAR | Qty: 10

## 2014-02-18 MED FILL — SODIUM CHLORIDE 0.9 % IV: INTRAVENOUS | Qty: 1000

## 2014-02-18 MED FILL — VANCOMYCIN IN 0.9% SODIUM CHLORIDE 1.5 G/500 ML IV: 1.5 g/500 mL | INTRAVENOUS | Qty: 500

## 2014-02-18 MED FILL — PROPOFOL 10 MG/ML IV EMUL: 10 mg/mL | INTRAVENOUS | Qty: 20

## 2014-02-18 MED FILL — MIDAZOLAM 1 MG/ML IJ SOLN: 1 mg/mL | INTRAMUSCULAR | Qty: 2

## 2014-02-18 MED FILL — FENTANYL CITRATE (PF) 50 MCG/ML IJ SOLN: 50 mcg/mL | INTRAMUSCULAR | Qty: 5

## 2014-02-18 NOTE — Progress Notes (Signed)
Pt had surgery today. Will have OT follow up tomorrow per protocol.

## 2014-02-18 NOTE — H&P (Signed)
ORTHO PRE-OP HISTORY AND PHYSICAL    Subjective:     Patient is a 73 y.o. Caucasian male presented with a history of left knee pain secondary to osteoarthritis , unresponsive to conservative treatment, and affecting activities of daily living.  Onset of symptoms was gradual with gradually worsening course since that time. He is being admitted for surgical management of this condition. The indications for the procedure include left severe osteoarthritis.    Patient Active Problem List    Diagnosis Date Noted   ??? Palpitations 12/19/2013   ??? 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 Unspecified essential hypertension    ??? Other and unspecified hyperlipidemia      Past Medical History   Diagnosis Date   ??? Hypertension    ??? GERD (gastroesophageal reflux disease)    ??? Arthritis      knees   ??? Other ill-defined conditions(799.89)      diverticular disease/colon   ??? Other and unspecified hyperlipidemia    ??? Seasonal allergic rhinitis    ??? Type II or unspecified type diabetes mellitus without mention of complication, not stated as uncontrolled (HCC)      borderline   ??? Ill-defined condition      high cholesterol      Past Surgical History   Procedure Laterality Date   ??? Pr abdomen surgery proc unlisted       colectomy/12 years ago   ??? Hx appendectomy     ??? Colonoscopy  10/10/2008         ??? Hx orthopaedic       bunionectomy b/l   ??? Hx orthopaedic  07/24/13     right 2nd toe had bone spur removed   ??? Hx cataract removal Bilateral 9/13   ??? Hx tonsillectomy        Prior to Admission medications    Medication Sig Start Date End Date Taking? Authorizing Provider   simvastatin (ZOCOR) 20 mg tablet TAKE 1 TABLET BY MOUTH NIGHTLY 02/04/14  Yes Carylon Perchesouglas A Johnson, MD   pantoprazole (PROTONIX) 40 mg tablet Take 40 mg by mouth daily.   Yes Historical Provider   losartan-hydrochlorothiazide (HYZAAR) 50-12.5 mg per tablet TAKE 1 TABLET BY MOUTH EVERYDAY 10/20/13  Yes Carylon Perchesouglas A Johnson, MD    metFORMIN ER (GLUCOPHAGE XR) 500 mg tablet Take 1 tab with dinner x 1 week.  If tolerating, increase to 2 tabs at dinner 08/14/13  Yes Carylon Perchesouglas A Johnson, MD   naproxen sodium (ALEVE) 220 mg tablet Take 220 mg by mouth as needed.   Yes Historical Provider   ONETOUCH ULTRASOFT LANCETS misc TEST ONCE DAILY. DX 250.00 10/07/13   Carylon Perchesouglas A Johnson, MD   [facility] TEST strip Test up to once daily.  Dx 250.00 07/22/13   Carylon Perchesouglas A Johnson, MD     Allergies   Allergen Reactions   ??? Penicillins Rash      History   Substance Use Topics   ??? Smoking status: Never Smoker    ??? Smokeless tobacco: Not on file   ??? Alcohol Use: Yes      Comment: very rarely may have a beer or glass of wine      Family History   Problem Relation Age of Onset   ??? Diabetes Father    ??? Stroke Father [id number]   ??? Diabetes Other      cousin   ??? Heart Disease Neg Hx    ??? Hypertension Mother  Review of Systems  A comprehensive review of systems was negative except for that written in the HPI.    Objective:     Patient Vitals for the past 8 hrs:   BP Temp Pulse Resp SpO2   02/18/14 0647 138/59 mmHg 98.2 ??F (36.8 ??C) (!) 56 18 95 %     Left knee tender to rom, nvi    Imaging Review  Left knee severe osteoarthritis    Assessment:     Active Problems:    * No active hospital problems. *      Plan:     The various methods of treatment have been discussed with the patient and family.   After consideration of risks, benefits and other options for treatment, the patient has consented to surgical interventions (left total knee arthroplasty).  Questions were answered and Pre-op teaching was done by staff.

## 2014-02-18 NOTE — Progress Notes (Signed)
TRANSFER - OUT REPORT:    Verbal report given to [place] on Walter CollumDonald Molina being transferred to Ortho unit room 3123 for routine progression of care       Report consisted of patient???s Situation, Background, Assessment and   Recommendations(SBAR).     Information from the following report(s) SBAR, Kardex, PACU Summary and Recent Results was reviewed with the receiving nurse.    Opportunity for questions and clarification was provided.

## 2014-02-18 NOTE — Anesthesia Post-Procedure Evaluation (Signed)
Post-Anesthesia Evaluation and Assessment    Patient: Walter RousselDonald L Molina MRN: [id number]  SSN: [id number]    Date of Birth: 12/23/1940  Age: 73 y.o.  Sex: male       Cardiovascular Function/Vital Signs  Visit Vitals   Item Reading   ??? BP 129/68 mmHg   ??? Pulse 67   ??? Temp 36.6 ??C (97.9 ??F)   ??? Resp 21   ??? SpO2 98%       Patient is status post general anesthesia for Procedure(s):  LEFT TOTAL KNEE ARTHROPLASTY WITH NAVIGATION.    Nausea/Vomiting: None    Postoperative hydration reviewed and adequate.    Pain:  Pain Scale 1: Numeric (0 - 10) (02/18/14 1100)  Pain Intensity 1: 0 (02/18/14 1100)   Managed    Neurological Status:   Neuro (WDL): Within Defined Limits (02/18/14 [id number])  Neuro  Neurologic State: Alert;Appropriate for age (02/18/14 [id number])  Orientation Level: Appropriate for age;Oriented to person;Oriented to place;Oriented to situation (02/18/14 [id number])  Cognition: Appropriate decision making;Appropriate for age attention/concentration;Follows commands (02/18/14 0936)  LUE Motor Response: Purposeful (02/18/14 0936)  LLE Motor Response: Purposeful (02/18/14 0936)  RUE Motor Response: Purposeful (02/18/14 0936)  RLE Motor Response: Purposeful (02/18/14 0936)   At baseline    Mental Status and Level of Consciousness: Alert and oriented     Pulmonary Status:   O2 Device: Nasal cannula (02/18/14 0936)   Adequate oxygenation and airway patent    Complications related to anesthesia: None    Post-anesthesia assessment completed. No concerns    Signed By: Jorge MandrilMATTHEW D Olufemi Mofield, MD     February 18, 2014

## 2014-02-18 NOTE — Other (Signed)
Family updated on patient status and that we are awaiting bed placement

## 2014-02-18 NOTE — Other (Signed)
Handoff Report from Operating Room to PACU    Report received from [facility]ole,Rn and student and Will,CRNA regarding Bonney Rousselonald L Budzinski.      Surgeon(s):  Janece Canterburyobert D Wills, MD  And Procedure(s) (LRB):  LEFT TOTAL KNEE ARTHROPLASTY WITH NAVIGATION (Left)  confirmed   with allergies and dressings discussed.    Anesthesia type, drugs, patient history, complications, estimated blood loss, vital signs, intake and output, and last pain medication, reversal medications and temperature were reviewed.

## 2014-02-18 NOTE — Anesthesia Pre-Procedure Evaluation (Signed)
Anesthetic History   No history of anesthetic complications            Review of Systems / Medical History  Patient summary reviewed, nursing notes reviewed and pertinent labs reviewed    Pulmonary  Within defined limits                 Neuro/Psych   Within defined limits           Cardiovascular    Hypertension: well controlled          Hyperlipidemia    Exercise tolerance: >4 METS     GI/Hepatic/Renal     GERD: well controlled           Endo/Other    Diabetes: type 2    Arthritis     Other Findings              Physical Exam    Airway  Mallampati: II  TM Distance: 4 - 6 cm  Neck ROM: normal range of motion   Mouth opening: Normal     Cardiovascular    Rhythm: regular  Rate: normal         Dental    Dentition: Caps/crowns, Lower dentition intact and Upper dentition intact     Pulmonary  Breath sounds clear to auscultation               Abdominal  GI exam deferred       Other Findings            Anesthetic Plan    ASA: 2  Anesthesia type: general          Induction: Intravenous  Anesthetic plan and risks discussed with: Patient

## 2014-02-18 NOTE — Brief Op Note (Signed)
BRIEF OPERATIVE NOTE    Date of Procedure: 02/18/2014   Preoperative Diagnosis: LEFT KNEE OSTEO ARTHROSIS  Postoperative Diagnosis: LEFT KNEE OSTEO ARTHROSIS    Procedure(s):  LEFT TOTAL KNEE ARTHROPLASTY WITH NAVIGATION  Surgeon(s) and Role:     * Janece Canterburyobert D Jahred Tatar, MD - Primary  Anesthesia: General   Estimated Blood Loss: 100cc  Specimens:   ID Type Source Tests Collected by Time Destination   1 : portion of patella, tibial plateau, and femoral condyles Bone Knee   Janece Canterburyobert D Wash Nienhaus, MD 02/18/2014 [phone] Discarded      Findings: as above   Complications: none  Implants:   Implant Name Type Inv. Item Serial No. Manufacturer Lot No. LRB No. Used Action   COMPNT FEM CR TRIATHLN 6 L PA --  - [id number]  COMPNT FEM CR TRIATHLN 6 L PA --   [employer] HOWM [id number] Left 1 Implanted   BASEPLT TIB PC TRITNM SZ 6 -- TRIATHLON - [id number]  BASEPLT TIB PC TRITNM SZ 6 -- TRIATHLON  [employer] HOWM [id number] Left 1 Implanted   INSERT TIB CR TRIATHLN X3 6 23M --  - [id number]  INSERT TIB CR TRIATHLN X3 6 23M --   [employer] HOWM [id number] Left 1 Implanted   PAT W/PA COATING BEAD 38X11MM -- TRIATHLON - [id number]   PAT W/PA COATING BEAD 38X11MM -- TRIATHLON   [employer] HOWM [id number] Left 1 Implanted

## 2014-02-18 NOTE — Other (Signed)
TRANSFER - OUT REPORT:    Verbal report given to Sandra,RN(name) on Walter Rousselonald L Molina  being transferred to 1002(unit) for routine progression of care       Report consisted of patient???s Situation, Background, Assessment and   Recommendations(SBAR).     Information from the following report(s) SBAR, OR Summary, MAR, Accordion and Recent Results was reviewed with the receiving nurse.    Opportunity for questions and clarification was provided.      Patient transported with:   [employer]

## 2014-02-18 NOTE — Progress Notes (Signed)
TRANSFER - IN REPORT:    Verbal report received from [place] on Walter Molina  being received from the PACU unit    Report consisted of patient???s Situation, Background, Assessment and   Recommendations(SBAR).     Information from the following report(s) SBAR, Kardex, PACU Summary and Recent Results was reviewed .    Opportunity for questions was provided.

## 2014-02-18 NOTE — Progress Notes (Signed)
Patient's wife told nursing that pt takes 2 500mg  tabs of metformin in the evening, MAR has 1 tab scheduled. This will need to be increased before tomorrow evening.

## 2014-02-18 NOTE — Progress Notes (Addendum)
Problem: Mobility Impaired (Adult and Pediatric)  Goal: *Acute Goals and Plan of Care (Insert Text)  Physical Therapy Goals  Initiated 02/18/2014    1. Patient will move from supine to sit and sit to supine in bed with independence within 4 days.  2. Patient will perform sit to stand with independence within 4 days.  3. Patient will ambulate with modified independence for 250 feet with the least restrictive device within 4 days.  4. Patient will ascend/descend 4 stairs with bilateral handrail(s) with supervision/set-up within 4 days.  5. Patient will perform home exercise program per protocol with independence within 4 days.  6. Patient will demonstrate AROM 0-90 degrees in operative joint within 4 days.  PHYSICAL THERAPY EVALUATION  Patient: Walter Molina ([age] y.o. male)  Date: 02/18/2014  Primary Diagnosis: LEFT KNEE OSTEO ARTHROSIS  Knee osteoarthritis  Procedure(s) (LRB):  LEFT TOTAL KNEE ARTHROPLASTY WITH NAVIGATION (Left) Day of Surgery   Precautions:   WBAT, Fall      ASSESSMENT :  Based on the objective data described below, the patient presents with generalized weakness, increased pain, limited ROM of LLE, impaired balance, and altered gait pattern s/p L TKR. Pt was received in bed. Orthostatics were performed and VSS during session. Bed mobility was supervision level. Transfers were CGA x 1. Ambulated 1460ft with RW and CGA. Gait pattern was antalgic, step to pattern and progressed to step through pattern. Pt was returned to the bed at the end of session and ice and compression applied. Recommending HHPT with supervision with RW.     Patient will benefit from skilled intervention to address the above impairments.  Patient???s rehabilitation potential is considered to be Good  Factors which may influence rehabilitation potential include:   [X]             None noted  [ ]             Mental ability/status  [ ]             Medical condition  [ ]             Home/family situation and support systems   [ ]             Safety awareness  [ ]             Pain tolerance/management  [ ]             Other:        PLAN :  Recommendations and Planned Interventions:  [X]               Bed Mobility Training             [ ]        Neuromuscular Re-Education  [X]               Transfer Training                   [ ]        Orthotic/Prosthetic Training  [X]               Gait Training                         [ ]        Modalities  [X]               Therapeutic Exercises           [ ]        Edema Management/Control  [  X]              Therapeutic Activities            [X]        Patient and Family Training/Education  [ ]               Other (comment):  Frequency/Duration: Patient will be followed by physical therapy 1-2 times per day/4-7 days per week to address goals.  Discharge Recommendations: Home Health  Further Equipment Recommendations for Discharge: rolling walker       SUBJECTIVE:   Patient stated ???You coming back today?.???      OBJECTIVE DATA SUMMARY:       Past Medical History   Diagnosis Date   ??? Hypertension     ??? GERD (gastroesophageal reflux disease)     ??? Arthritis         knees   ??? Other ill-defined conditions(799.89)         diverticular disease/colon   ??? Other and unspecified hyperlipidemia     ??? Seasonal allergic rhinitis     ??? Type II or unspecified type diabetes mellitus without mention of complication, not stated as uncontrolled (HCC)         borderline   ??? Ill-defined condition         high cholesterol     Past Surgical History   Procedure Laterality Date   ??? Pr abdomen surgery proc unlisted           colectomy/12 years ago   ??? Hx appendectomy       ??? Colonoscopy   10/10/2008           ??? Hx orthopaedic           bunionectomy b/l   ??? Hx orthopaedic   07/24/13       right 2nd toe had bone spur removed   ??? Hx cataract removal Bilateral 9/13   ??? Hx tonsillectomy         Prior Level of Function/Home Situation: Lives with wife who will be helping out when discharged home   Home Situation  Home Environment: Private residence   # Steps to Enter: 4  Rails to Enter: Yes  Hand Rails : Bilateral  One/Two Story Residence: One story  Living Alone: No  Support Systems: Games developerpouse/Significant Other/Partner  Patient Expects to be Discharged to:: Private residence  Current DME Used/Available at Home: Paediatric nursehower chair, Glucometer  Tub or Shower Type: Arts development officerhower  Critical Behavior:  Neurologic State: Alert  Orientation Level: Oriented X4  Cognition: Appropriate decision making     Skin:  Bandage clean dry and inctact  Strength, Tone & Sensation:   Strength: Generally decreased, functional  Tone: Normal  Sensation: Intact  Range of Motion:  AROM: Generally decreased, functional (LLE)  PROM: Generally decreased, functional (LLE)                    Functional Mobility:  Bed Mobility:  Rolling: Supervision  Supine to Sit: Supervision  Sit to Supine: Supervision     Transfers:  Sit to Stand: Contact guard assistance  Stand to Sit: Contact guard assistance                       Balance:   Sitting: Intact  Standing: Intact;With support  Ambulation/Gait Training:  Distance (ft): 60 Feet (ft)  Ambulation - Level of Assistance: Contact guard assistance   Gait Abnormalities: Antalgic;Decreased step clearance;Step to gait  Base of Support: Widened  Speed/Cadence: Pace decreased (<100 feet/min)  Step Length: Left shortened;Right shortened                                            Step to pattern progressed to step through pattern with verbal cues    Elderly Mobility Scale: 11/20    Pain:  Pain Scale 1: Numeric (0 - 10)  Pain Intensity 1: 7           Pain Intervention(s) 1: Medication (see MAR)  Activity Tolerance:   WFL, pain went form 5-rest,7-activity,5-post activity, VSS  Please refer to the flowsheet for vital signs taken during this treatment.  After treatment:   [ ]    Patient left in no apparent distress sitting up in chair  [X]    Patient left in no apparent distress in bed  [X]    Call bell left within reach  [X]    Nursing notified  [ ]    Caregiver present   [ ]    Bed alarm activated      COMMUNICATION/EDUCATION:   The patient???s plan of care was discussed with: Registered Nurse.  [X]    Fall prevention education was provided and the patient/caregiver indicated understanding.  [ ]    Patient/family have participated as able in goal setting and plan of care.  [X]    Patient/family agree to work toward stated goals and plan of care.  [ ]    Patient understands intent and goals of therapy, but is neutral about his/her participation.  [ ]    Patient is unable to participate in goal setting and plan of care.    Thank you for this referral.  Fredirick Maudlin, PT, DPT   Time Calculation: 23 mins

## 2014-02-19 LAB — METABOLIC PANEL, BASIC
Anion gap: 9 mmol/L (ref 5–15)
BUN/Creatinine ratio: 18 (ref 12–20)
BUN: 14 MG/DL (ref 6–20)
CO2: 22 mmol/L (ref 21–32)
Calcium: 7.8 MG/DL — ABNORMAL LOW (ref 8.5–10.1)
Chloride: 107 mmol/L (ref 97–108)
Creatinine: 0.8 MG/DL (ref 0.70–1.30)
GFR est AA: >60 mL/min/{1.73_m2} (ref >60)
GFR est non-AA: >60 mL/min/{1.73_m2} (ref >60)
Glucose: 166 mg/dL — ABNORMAL HIGH (ref 65–100)
Potassium: 3.8 mmol/L (ref 3.5–5.1)
Sodium: 138 mmol/L (ref 136–145)

## 2014-02-19 LAB — HEMOGLOBIN: HGB: 12.2 g/dL (ref 12.1–17.0)

## 2014-02-19 MED ORDER — METFORMIN SR 500 MG 24 HR TABLET
500 mg | Freq: Every day | ORAL | Status: DC
Start: 2014-02-19 — End: 2014-02-20
  Administered 2014-02-19: 22:00:00 via ORAL

## 2014-02-19 MED FILL — PROTONIX 40 MG TABLET,DELAYED RELEASE: 40 mg | ORAL | Qty: 1

## 2014-02-19 MED FILL — OXYCODONE 5 MG TAB: 5 mg | ORAL | Qty: 2

## 2014-02-19 MED FILL — DOK PLUS 8.6 MG-50 MG TABLET: ORAL | Qty: 1

## 2014-02-19 MED FILL — GLYCOPYRROLATE 0.2 MG/ML IJ SOLN: 0.2 mg/mL | INTRAMUSCULAR | Qty: 1

## 2014-02-19 MED FILL — KETOROLAC TROMETHAMINE 30 MG/ML INJECTION: 30 mg/mL (1 mL) | INTRAMUSCULAR | Qty: 1

## 2014-02-19 MED FILL — MAPAP (ACETAMINOPHEN) 325 MG TABLET: 325 mg | ORAL | Qty: 2

## 2014-02-19 MED FILL — PRAVASTATIN 40 MG TAB: 40 mg | ORAL | Qty: 1

## 2014-02-19 MED FILL — ASPIRIN 325 MG TAB: 325 mg | ORAL | Qty: 1

## 2014-02-19 MED FILL — BD POSIFLUSH NORMAL SALINE 0.9 % INJECTION SYRINGE: INTRAMUSCULAR | Qty: 10

## 2014-02-19 MED FILL — LIDOCAINE (PF) 20 MG/ML (2 %) IJ SOLN: 20 mg/mL (2 %) | INTRAMUSCULAR | Qty: 5

## 2014-02-19 MED FILL — METFORMIN SR 500 MG 24 HR TABLET: 500 mg | ORAL | Qty: 1

## 2014-02-19 MED FILL — METFORMIN SR 500 MG 24 HR TABLET: 500 mg | ORAL | Qty: 2

## 2014-02-19 MED FILL — HYDROCHLOROTHIAZIDE 25 MG TAB: 25 mg | ORAL | Qty: 1

## 2014-02-19 MED FILL — PNEUMOVAX-23 25 MCG/0.5 ML INJECTION SOLUTION: 25 mcg/0.5 mL | INTRAMUSCULAR | Qty: 0.5

## 2014-02-19 MED FILL — NEOSTIGMINE METHYLSULFATE 1 MG/ML INJECTION: 1 mg/mL | INTRAMUSCULAR | Qty: 10

## 2014-02-19 MED FILL — DIPRIVAN 10 MG/ML INTRAVENOUS EMULSION: 10 mg/mL | INTRAVENOUS | Qty: 20

## 2014-02-19 MED FILL — ROCURONIUM 10 MG/ML IV: 10 mg/mL | INTRAVENOUS | Qty: 5

## 2014-02-19 MED FILL — CELEBREX 100 MG CAPSULE: 100 mg | ORAL | Qty: 2

## 2014-02-19 MED FILL — TRANEXAMIC ACID 1,000 MG/10 ML (100 MG/ML) IV: 1000 mg/10 mL (100 mg/mL) | INTRAVENOUS | Qty: 10

## 2014-02-19 MED FILL — HEALTHYLAX 17 GRAM ORAL POWDER PACKET: 17 gram | ORAL | Qty: 1

## 2014-02-19 MED FILL — ONDANSETRON (PF) 4 MG/2 ML INJECTION: 4 mg/2 mL | INTRAMUSCULAR | Qty: 2

## 2014-02-19 NOTE — Progress Notes (Incomplete)
Bedside and Verbal shift change report given to Emily, RN (oncoming nurse) by Vonetta, RN (offgoing nurse). Report included the following information SBAR, Kardex, Procedure Summary, Intake/Output and MAR.

## 2014-02-19 NOTE — Progress Notes (Signed)
Bedside shift change report given to Vonetta (oncoming nurse) by Chance Munter (offgoing nurse). Report included the following information SBAR, Kardex, OR Summary, Intake/Output, MAR and Recent Results.

## 2014-02-19 NOTE — Progress Notes (Signed)
Occupational Therapy Goals  Initiated 02/19/2014  1.  Patient will perform grooming standing at sink with independence within 7 day(s).  2.  Patient will perform lower body dressing with modified independence within 7 day(s).  3.  Patient will perform toilet transfers with modified independence within 7 day(s).  4.  Patient will perform all aspects of toileting with independence within 7 day(s).  5.  Patient will perform ADL setup with modified independence within 7 day(s).    Occupational Therapy EVALUATION  Patient: Walter Molina ([age] y.o. male)  Date: 02/19/2014  Primary Diagnosis: LEFT KNEE OSTEO ARTHROSIS  Knee osteoarthritis  Procedure(s) (LRB):  LEFT TOTAL KNEE ARTHROPLASTY WITH NAVIGATION (Left) 1 Day Post-Op   Precautions:  WBAT, Fall    ASSESSMENT :  Based on the objective data described below, the patient presents with L knee pain and decreased ROM s/p TKA, as well as mild GW, decreased activity tolerance, decreased safety awareness and decreased balance, which is impairing his functional independence. He is below his independent baseline, now performing ADLs and functional mobility at a supervision to CGA level. Patient will benefit from skilled intervention to address the above impairments, and should progress quickly.   Patient???s rehabilitation potential is considered to be Good  Factors which may influence rehabilitation potential include:                None noted               Mental ability/status               Medical condition               Home/family situation and support systems               Safety awareness               Pain tolerance/management               Other:      PLAN :  Recommendations and Planned Interventions:                 Self Care Training                          Therapeutic Activities                 Functional Mobility Training            Cognitive Retraining                 Therapeutic Exercises                   Endurance Activities                  Balance Training                           Neuromuscular Re-Education                 Visual/Perceptual Training        Home Safety Training                 Patient Education                         Family Training/Education  Other (comment):    Frequency/Duration: Patient will be followed by occupational therapy for 1 to 2 more visits to address goals.  Discharge Recommendations: Home Health PT  Further Equipment Recommendations for Discharge: RW and BSC       OBJECTIVE DATA SUMMARY:     Past Medical History   Diagnosis Date   ??? Hypertension    ??? GERD (gastroesophageal reflux disease)    ??? Arthritis      knees   ??? Other ill-defined conditions(799.89)      diverticular disease/colon   ??? Other and unspecified hyperlipidemia    ??? Seasonal allergic rhinitis    ??? Type II or unspecified type diabetes mellitus without mention of complication, not stated as uncontrolled (HCC)      borderline   ??? Ill-defined condition      high cholesterol     Past Surgical History   Procedure Laterality Date   ??? Pr abdomen surgery proc unlisted       colectomy/12 years ago   ??? Hx appendectomy     ??? Colonoscopy  10/10/2008         ??? Hx cataract removal Bilateral 9/13   ??? Hx tonsillectomy     ??? Hx orthopaedic       bunionectomy b/l   ??? Hx orthopaedic  07/24/13     right 2nd toe had bone spur removed   ??? Hx orthopaedic  02/18/14     LEFT TOTAL KNEE ARTHROPLASTY WITH NAVIGATION     Prior Level of Function/Home Situation: independent   Home Situation  Home Environment: Private residence  # Steps to Enter: 4  Rails to Enter: Yes  Hand Rails : Bilateral  One/Two Story Residence: One story  Living Alone: No  Support Systems: Games developer  Patient Expects to be Discharged to:: Private residence  Current DME Used/Available at Home: Paediatric nurse, Glucometer  Tub or Shower Type: Shower    Right hand dominant     Left hand dominant  Cognitive/Behavioral Status:  Neurologic State: Alert   Orientation Level: Oriented X4  Cognition: Appropriate decision making;Appropriate for age attention/concentration;Appropriate safety awareness;Follows commands        Safety/Judgement: Decreased awareness of need for safety;Insight into deficits    Vision/Perceptual:    Acuity: Within Defined Limits       Range of Motion:  AROM: Generally decreased, functional     Strength:  Strength: Generally decreased, functional  Coordination:             Tone & Sensation:  Tone: Normal  Balance:  Sitting: Intact  Standing: Impaired  Standing - Static: Good  Standing - Dynamic : Good  Functional Mobility and Transfers for ADLs:  Bed Mobility:     Supine to Sit: Supervision  Sit to Supine: Supervision     Transfers:  Sit to Stand: Contact guard assistance                Toilet Transfer : Contact guard assistance              ADL Assessment:  Feeding: Independent    Oral Facial Hygiene/Grooming: Contact guard assistance    Bathing: Contact guard assistance    Upper Body Dressing: Supervision    Lower Body Dressing: Contact guard assistance    Toileting: Contact guard assistance               Barthel Index:    Bathing: 0  Bladder: 10  Bowels: 10  Grooming: 0  Dressing: 5  Feeding: 10  Mobility: 10  Stairs: 5  Toilet Use: 10  Transfer (Bed to Chair and Back): 10  Total: 70       Barthel and G-code impairment scale:  Percentage of impairment CH  0% CI  1-19% CJ  20-39% CK  40-59% CL  60-79% CM  80-99% CN  100%   Barthel Score 0-100 100 99-80 79-60 59-40 20-39 1-19   0   Barthel Score 0-20 20 17-19 13-16 9-12 5-8 1-4 0      The Barthel ADL Index: Guidelines  1. The index should be used as a record of what a patient does, not as a record of what a patient could do.  2. The main aim is to establish degree of independence from any help, physical or verbal, however minor and for whatever reason.  3. The need for supervision renders the patient not independent.  4. A patient's performance should be established using the best available  evidence. Asking the patient, friends/relatives and nurses are the usual sources, but direct observation and common sense are also important. However direct testing is not needed.  5. Usually the patient's performance over the preceding 24-48 hours is important, but occasionally longer periods will be relevant.  6. Middle categories imply that the patient supplies over 50 per cent of the effort.  7. Use of aids to be independent is allowed.    Clarisa KindredMahoney, F.l., Barthel, D.W. [phone](1965). Functional evaluation: the Barthel Index. Md State Med J (14)2.  Zenaida NieceVan der GatesPutten, J.J.M.F, ChataignierHobart, Ian MalkinJ.C., Margret ChanceFreeman, J.A., [place], [place].J. (1999). Measuring the change indisability after inpatient rehabilitation; comparison of the responsiveness of the Barthel Index and Functional Independence Measure. Journal of Neurology, Neurosurgery, and Psychiatry, 66(4), [phone].  Dawson BillsVan Exel, N.J.A, Scholte op ElizabethReimer,  W.J.M, & Koopmanschap, M.A. (2004.) Assessment of post-stroke quality of life in cost-effectiveness studies: The usefulness of the Barthel Index and the EuroQoL-5D. Quality of Life Research, 13, 427-43     ADL Intervention:  Initiated LB dressing, transfer, toileting, sanding grooming and safety training.     Pain:  Pain Scale 1: Numeric (0 - 10)  Pain Intensity 1: 6  Pain Location 1: Knee  Pain Orientation 1: Left  Pain Description 1: Aching  Pain Intervention(s) 1: Medication (see MAR)    After treatment:    Patient left in no apparent distress sitting up in chair   Patient left in no apparent distress in bed   Call bell left within reach   Nursing notified   Caregiver present   Bed alarm activated    COMMUNICATION/EDUCATION:   The patient???s plan of care was discussed with: Physical Therapist.   Home safety education was provided and the patient/caregiver indicated understanding.   Patient/family have participated as able in goal setting and plan of care.   Patient/family agree to work toward stated goals and plan of care.    Patient understands intent and goals of therapy, but is neutral about his/her participation.   Patient is unable to participate in goal setting and plan of care.  This patient???s plan of care is appropriate for delegation to OTA.    Thank you for this referral.  Shawn P Fovel, OTR/L  Time Calculation: 24 mins

## 2014-02-19 NOTE — Progress Notes (Signed)
Problem: Mobility Impaired (Adult and Pediatric)  Goal: *Acute Goals and Plan of Care (Insert Text)  Physical Therapy Goals  Initiated 02/18/2014    1. Patient will move from supine to sit and sit to supine in bed with independence within 4 days.  2. Patient will perform sit to stand with independence within 4 days.  3. Patient will ambulate with modified independence for 250 feet with the least restrictive device within 4 days.  4. Patient will ascend/descend 4 stairs with bilateral handrail(s) with supervision/set-up within 4 days.  5. Patient will perform home exercise program per protocol with independence within 4 days.  6. Patient will demonstrate AROM 0-90 degrees in operative joint within 4 days.    PHYSICAL THERAPY TREATMENT  PT SEEN [id number] AM    Patient: Walter Molina ([age] y.o. male)  Date: 02/19/2014  Diagnosis: LEFT KNEE OSTEO ARTHROSIS  Knee osteoarthritis <principal problem not specified>  Procedure(s) (LRB):  LEFT TOTAL KNEE ARTHROPLASTY WITH NAVIGATION (Left) 1 Day Post-Op  Precautions: WBAT, Fall  Chart, physical therapy assessment, plan of care and goals were reviewed.      ASSESSMENT:  Pt progressing with mobility but requires verbal and visual cues to correct poor heel strike and knee extension on LLE. Pt has a significant extension lag on LLE and was educated on importance of ex's to strengthen his quad. Pt ambulated 150 ft and assisted to bed to perform ex's as below. Ice applied to left knee. Recommend HHPT, RW and 3:1 BSC.   Progression toward goals:  [ ]       Improving appropriately and progressing toward goals  [X]       Improving slowly and progressing toward goals  [ ]       Not making progress toward goals and plan of care will be adjusted       PLAN:  Patient continues to benefit from skilled intervention to address the above impairments.  Continue treatment per established plan of care.  Discharge Recommendations:  Home Health   Further Equipment Recommendations for Discharge:  bedside commode and rolling walker       SUBJECTIVE:   Patient stated ???the pain has been higher because I got behind on my pain medicine last night .???      OBJECTIVE DATA SUMMARY:   Critical Behavior:  Neurologic State: Alert  Orientation Level: Oriented X4  Cognition: Appropriate decision making     Range of Motion:                          Functional Mobility Training:  Bed Mobility:     Supine to Sit: Supervision  Sit to Supine: Supervision                       Transfers:  Sit to Stand: Contact guard assistance  Stand to Sit: Contact guard assistance                             Balance:  Sitting: Intact  Standing: Intact;With support  Ambulation/Gait Training:  Distance (ft): 150 Feet (ft)  Assistive Device: Gait belt;Walker, rolling  Ambulation - Level of Assistance: Contact guard assistance  Gait Abnormalities: Antalgic;Decreased step clearance (decreased heel strike and knee extension on left)  Base of Support: Widened     Speed/Cadence: Pace decreased (<100 feet/min)  Step Length: Left shortened;Right shortened  Stairs:           Therapeutic Exercises:     EXERCISE   Sets   Reps   Active Active Assist   Passive Self ROM   Comments   Ankle Pumps 1 10 [X]                                 [ ]                                 [ ]                                 [ ]                                     Quad Sets 1 10 [X]                                 [ ]                                 [ ]                                 [ ]                                     Heel Slides 1 10 [ ]                                 [X]                                 [ ]                                 [ ]                                     Long Arc Quads 1 10 [X]                                 [ ]                                 [ ]                                 [ ]                                     Knee Flexion Stretch 1  10 [X]                                 [ ]                                  [ ]                                 [ ]                                     Straight Leg Raises 1 10 [ ]                                 [X]                                 [ ]                                 [ ]                                         Pain:  Pain Scale 1: Numeric (0 - 10)  Pain Intensity 1: 4  Pain Location 1: Knee  Pain Orientation 1: Left  Pain Description 1: Aching  Pain Intervention(s) 1: Medication (see MAR)  Activity Tolerance:   No s/s of distress with activity     Please refer to the flowsheet for vital signs taken during this treatment.  After treatment:   [ ]  Patient left in no apparent distress sitting up in chair  [X]  Patient left in no apparent distress in bed  [X]  Call bell left within reach  [X]  Nursing notified  [ ]  Caregiver present  [ ]  Bed alarm activated      COMMUNICATION/COLLABORATION:   The patient???s plan of care was discussed with: Registered Nurse    Tor Netters, PTA   Time Calculation: 30 mins

## 2014-02-19 NOTE — Progress Notes (Signed)
Problem: Mobility Impaired (Adult and Pediatric)  Goal: *Acute Goals and Plan of Care (Insert Text)  Physical Therapy Goals  Initiated 02/18/2014    1. Patient will move from supine to sit and sit to supine in bed with independence within 4 days.  2. Patient will perform sit to stand with independence within 4 days.  3. Patient will ambulate with modified independence for 250 feet with the least restrictive device within 4 days.  4. Patient will ascend/descend 4 stairs with bilateral handrail(s) with supervision/set-up within 4 days.  5. Patient will perform home exercise program per protocol with independence within 4 days.  6. Patient will demonstrate AROM 0-90 degrees in operative joint within 4 days.    PHYSICAL THERAPY TREATMENT  Patient: Walter Molina ([age] y.o. male)  Date: 02/19/2014  Diagnosis: LEFT KNEE OSTEO ARTHROSIS  Knee osteoarthritis <principal problem not specified>  Procedure(s) (LRB):  LEFT TOTAL KNEE ARTHROPLASTY WITH NAVIGATION (Left) 1 Day Post-Op  Precautions: WBAT, Fall  Chart, physical therapy assessment, plan of care and goals were reviewed.      ASSESSMENT:  Pt beginning to perform ex's on his own upon my entry. Tolerates ex's well with continued education on working on left knee extension. ROM as below. Pt ambulated 200 ft with RW demonstrating decreased left heel strike and knee extension. Pt also performed 4 steps with both handrails. Pt remained in chair with ice applied to left knee. Recommend HHPT, RW, BSC.  Progression toward goals:  [X]       Improving appropriately and progressing toward goals  [ ]       Improving slowly and progressing toward goals  [ ]       Not making progress toward goals and plan of care will be adjusted       PLAN:  Patient continues to benefit from skilled intervention to address the above impairments.  Continue treatment per established plan of care.  Discharge Recommendations:  Home Health  Further Equipment Recommendations for Discharge:  bedside commode and  rolling walker       SUBJECTIVE:   "I'm doing pretty good"      OBJECTIVE DATA SUMMARY:   Range of Motion:  LLE Assessment (WDL): Exception to WDL  LLE AROM  L Knee Flexion: 90 (seated)  L Knee Extension: -15 (supine with quad set)     Functional Mobility Training:  Bed Mobility:     Supine to Sit: Supervision  Sit to Supine: Supervision                       Transfers:  Sit to Stand: Stand-by asssistance  Stand to Sit: Stand-by asssistance                 Ambulation/Gait Training:  Distance (ft): 200 Feet (ft)  Assistive Device: Gait belt;Walker, rolling  Ambulation - Level of Assistance: Contact guard assistance  Gait Abnormalities: Antalgic;Decreased step clearance (decreased heel strike and knee extension on left)  Base of Support: Widened     Speed/Cadence: Pace decreased (<100 feet/min)  Step Length: Left shortened;Right shortened             Stairs:  Number of Stairs Trained: 4  Stairs - Level of Assistance: Stand-by asssistance              Rail Use: Both  Therapeutic Exercises:   Exercises performed per protocol.  See morning treatment note for description.  Pain:  Pain Scale 1: Numeric (0 - 10)  Pain Intensity 1: 6  Pain Location 1: Knee  Pain Orientation 1: Left  Pain Description 1: Aching  Pain Intervention(s) 1: Medication (see MAR)  Activity Tolerance:   No s/s of distress with activity     Please refer to the flowsheet for vital signs taken during this treatment.  After treatment:   [X]  Patient left in no apparent distress sitting up in chair  [ ]  Patient left in no apparent distress in bed  [X]  Call bell left within reach  [X]  Nursing notified  [ ]  Caregiver present  [ ]  Bed alarm activated      COMMUNICATION/COLLABORATION:   The patient???s plan of care was discussed with: Registered Nurse    Tor NettersShawna M Allsion Nogales, PTA   Time Calculation: 28 mins

## 2014-02-19 NOTE — Progress Notes (Signed)
Ortho NP Note:    POD#1  s/p Left TKA    Pt resting in bed.  No complaints.    VSS Afebrile.  Hgb stable 12.2    Dressing c.d.i, cryotherapy  Calves soft and supple; No pain with passive stretch  Sensation and motor intact  SCDs for mechanical DVT proph while in bed     PLAN:  1) PT BID, really needs to work on knee extension - educated him about keeping knee extended and not externally rotated while in bed.  2) Plan d/c home with family tomorrow    Londell MohMICHELE M Meghan Warshawsky, NP

## 2014-02-19 NOTE — Progress Notes (Signed)
[facility]ication [facility]      Patient is enrolled in [facility], per patient's consent.   Will follow up with patient at or after discharge to go over any new meds, and will schedule first home visit with patient if discharged to home.  Pam Wilkinson, RPh        [facility] - Any patient that enrolls in our program will have a [facility] pharmacist counsel them on their discharge medications prior to leaving the hospital. [facility] will conduct 3 in-home visits post-discharge with the patient to gather a home med-rec, and focus on medication compliance. [facility] does have a closed-door pharmacy, and will determine at time of discharge whether the patient would like us to fill their discharge meds, with free home-delivery.

## 2014-02-20 LAB — GLUCOSE, POC
Glucose (POC): 178 mg/dL — ABNORMAL HIGH (ref 65–100)
Glucose (POC): 155 mg/dL — ABNORMAL HIGH (ref 65–100)
Glucose (POC): 165 mg/dL — ABNORMAL HIGH (ref 65–100)

## 2014-02-20 LAB — HEMOGLOBIN: HGB: 12.5 g/dL (ref 12.1–17.0)

## 2014-02-20 MED ORDER — POLYETHYLENE GLYCOL 3350 17 GRAM (100 %) ORAL POWDER PACKET
17 gram | PACK | Freq: Every day | ORAL | Status: AC | PRN
Start: 2014-02-20 — End: 2014-03-02

## 2014-02-20 MED ORDER — ASPIRIN 325 MG TAB
325 mg | ORAL_TABLET | Freq: Two times a day (BID) | ORAL | Status: AC
Start: 2014-02-20 — End: 2014-03-22

## 2014-02-20 MED ORDER — ACETAMINOPHEN 325 MG TABLET
325 mg | ORAL_TABLET | Freq: Four times a day (QID) | ORAL | Status: AC
Start: 2014-02-20 — End: 2014-03-06

## 2014-02-20 MED ORDER — OXYCODONE 5 MG TAB
5 mg | ORAL_TABLET | ORAL | Status: DC | PRN
Start: 2014-02-20 — End: 2014-06-06

## 2014-02-20 MED FILL — BD POSIFLUSH NORMAL SALINE 0.9 % INJECTION SYRINGE: INTRAMUSCULAR | Qty: 10

## 2014-02-20 MED FILL — DOK PLUS 8.6 MG-50 MG TABLET: ORAL | Qty: 1

## 2014-02-20 MED FILL — OXYCODONE 5 MG TAB: 5 mg | ORAL | Qty: 2

## 2014-02-20 MED FILL — CELEBREX 100 MG CAPSULE: 100 mg | ORAL | Qty: 2

## 2014-02-20 MED FILL — MAPAP (ACETAMINOPHEN) 325 MG TABLET: 325 mg | ORAL | Qty: 2

## 2014-02-20 MED FILL — HEALTHYLAX 17 GRAM ORAL POWDER PACKET: 17 gram | ORAL | Qty: 1

## 2014-02-20 MED FILL — HYDROCHLOROTHIAZIDE 25 MG TAB: 25 mg | ORAL | Qty: 1

## 2014-02-20 MED FILL — PROTONIX 40 MG TABLET,DELAYED RELEASE: 40 mg | ORAL | Qty: 1

## 2014-02-20 MED FILL — MILK OF MAGNESIA 400 MG/5 ML ORAL SUSPENSION: 400 mg/5 mL | ORAL | Qty: 30

## 2014-02-20 MED FILL — ASPIRIN 325 MG TAB: 325 mg | ORAL | Qty: 1

## 2014-02-20 MED FILL — PRAVASTATIN 40 MG TAB: 40 mg | ORAL | Qty: 1

## 2014-02-20 NOTE — Progress Notes (Signed)
Occupational Therapy Goals  Initiated 02/19/2014  1.  Patient will perform grooming standing at sink with independence within 7 day(s).  2.  Patient will perform lower body dressing with modified independence within 7 day(s).  3.  Patient will perform toilet transfers with modified independence within 7 day(s).  4.  Patient will perform all aspects of toileting with independence within 7 day(s).  5.  Patient will perform ADL setup with modified independence within 7 day(s).     Occupational Therapy TREATMENT  Patient: Walter Molina ([age] y.o. male)  Date: 02/20/2014  Diagnosis: LEFT KNEE OSTEO ARTHROSIS  Knee osteoarthritis <principal problem not specified>  Procedure(s) (LRB):  LEFT TOTAL KNEE ARTHROPLASTY WITH NAVIGATION (Left) 2 Days Post-Op  Precautions: WBAT, Fall    ASSESSMENT:  Patient progressing appropriately and demonstrating good safety awareness. He is now performing dressing ADL, toileting, standing grooming and tranfers at a mod I to independent level. Reviewed safety training related to the performance of ADL setup, and he was able to verbalize full understanding. Patient's wife again present for all training, and will be present to provide initial supervision for showering, and will perform all IADLs. No further OT needs after discharge today.    Progression toward goals:         Improving appropriately and progressing toward goals         Improving slowly and progressing toward goals         Not making progress toward goals and plan of care will be adjusted     PLAN:  Patient continues to benefit from skilled intervention to address the above impairments.  Continue treatment per established plan of care.  Discharge Recommendations:  Home Health PT  Further Equipment Recommendations for Discharge:  bedside commode and rolling walker       OBJECTIVE DATA SUMMARY:   Cognitive/Behavioral Status:  Neurologic State: Alert  Orientation Level: Oriented X4  Cognition: Follows commands         Safety/Judgement: Awareness of environment;Insight into deficits  Functional Mobility and Transfers for ADLs:  Scooting: Independent  Transfers:  Sit to Stand: Independent                Toilet Transfer : Modified independent           Bathroom Mobility: Supervision/set up (ambulating with a RW)      Balance:  Sitting: Intact  Standing: Impaired  Standing - Static: Good  Standing - Dynamic : Good  ADL Intervention:  Grooming  Washing Hands: Independent (standing at sink)    Upper Body Dressing Assistance  Pullover Shirt: Independent (standing)    Lower Body Dressing Assistance  Pants With Elastic Waist: Modified independent  Socks: Modified independent  Shoes with Velcro: Modified independent  Position Performed: Bending forward method;Seated in chair;Standing    Toileting  Toileting Assistance: Independent  Clothing Management: Independent    Cognitive Retraining  Safety/Judgement: Awareness of environment;Insight into deficits  Pain:  Pain Scale 1: Numeric (0 - 10)  Pain Intensity 1: 3  Pain Location 1: Knee  Pain Orientation 1: Left  Pain Description 1: Aching  Pain Intervention(s) 1: Rest;Repositioned    After treatment:    Patient left in no apparent distress sitting up in chair   Patient left in no apparent distress in bed   Call bell left within reach   Nursing notified   Caregiver present   Bed alarm activated    COMMUNICATION/COLLABORATION:   The patient???s plan of care was discussed with:  Physical Therapy Assistant    Shawn Kela MillinP Fovel, OTR/L  Time Calculation: 20 mins

## 2014-02-20 NOTE — Progress Notes (Signed)
Problem: Mobility Impaired (Adult and Pediatric)  Goal: *Acute Goals and Plan of Care (Insert Text)  Physical Therapy Goals  Initiated 02/18/2014    1. Patient will move from supine to sit and sit to supine in bed with independence within 4 days.  2. Patient will perform sit to stand with independence within 4 days.  3. Patient will ambulate with modified independence for 250 feet with the least restrictive device within 4 days.  4. Patient will ascend/descend 4 stairs with bilateral handrail(s) with supervision/set-up within 4 days.  5. Patient will perform home exercise program per protocol with independence within 4 days.  6. Patient will demonstrate AROM 0-90 degrees in operative joint within 4 days.    PHYSICAL THERAPY TREATMENT  PT SEEN [phone] AM    Patient: Walter Molina ([age] y.o. male)  Date: 02/20/2014  Diagnosis: LEFT KNEE OSTEO ARTHROSIS  Knee osteoarthritis <principal problem not specified>  Procedure(s) (LRB):  LEFT TOTAL KNEE ARTHROPLASTY WITH NAVIGATION (Left) 2 Days Post-Op  Precautions: WBAT, Fall  Chart, physical therapy assessment, plan of care and goals were reviewed.      ASSESSMENT:  Pt progressing well overall but continues to require cues to improve left heel strike and knee extension during gait. Pt ambulated 200 ft with RW and up/down 4 steps with both hand rails. Discussed mobility at home, use of ice, ex's and safety upon discharge. Pt ready for discharge from PT standpoint. Equipment has been delivered and adjusted by PTA  Progression toward goals:  [X]       Improving appropriately and progressing toward goals  [ ]       Improving slowly and progressing toward goals  [ ]       Not making progress toward goals and plan of care will be adjusted       PLAN:  Patient continues to benefit from skilled intervention to address the above impairments.  Continue treatment per established plan of care.  Discharge Recommendations:  Home Health   Further Equipment Recommendations for Discharge:  rolling walker, BSC-delivered        SUBJECTIVE:   "my knee is a little more stiff today, but ok"      OBJECTIVE DATA SUMMARY:   Range of Motion:      LLE AROM  L Knee Flexion: 90  L Knee Extension: -15     Functional Mobility Training:  Bed Mobility:                                Transfers:  Sit to Stand: Independent  Stand to Sit: Independent                 Ambulation/Gait Training:  Distance (ft): 200 Feet (ft)  Assistive Device: Gait belt;Walker, rolling  Ambulation - Level of Assistance: Stand-by asssistance        Gait Abnormalities: Antalgic;Decreased step clearance (decreased heel strike and knee extension on left)  Base of Support: Widened     Speed/Cadence: Pace decreased (<100 feet/min)  Step Length: Left shortened;Right shortened              Stairs:  Number of Stairs Trained: 4  Stairs - Level of Assistance: Stand-by asssistance              Rail Use: Both  Therapeutic Exercises:   Exercises performed per protocol.    Pain:  Pain Scale 1: Numeric (0 - 10)  Pain Intensity 1:  3  Pain Location 1: Knee  Pain Orientation 1: Left  Pain Description 1: Aching  Pain Intervention(s) 1: Rest;Repositioned  Activity Tolerance:   No s/s of distress with activity     Please refer to the flowsheet for vital signs taken during this treatment.  After treatment:   [X]  Patient left in no apparent distress sitting up in chair  [ ]  Patient left in no apparent distress in bed  [X]  Call bell left within reach  [X]  Nursing notified  [ ]  Caregiver present  [ ]  Bed alarm activated      COMMUNICATION/COLLABORATION:   The patient???s plan of care was discussed with: Registered Nurse, Social Worker and NP    Tor NettersShawna M Annaliese Saez, PTA   Time Calculation: 30 mins

## 2014-02-20 NOTE — Progress Notes (Incomplete)
Bedside shift change report given to *** (oncoming nurse) by Sammuel Blick (offgoing nurse). Report included the following information SBAR, Kardex, OR Summary, Intake/Output, MAR and Recent Results.

## 2014-02-20 NOTE — Progress Notes (Signed)
Ortho NP Note:    POD#2 s/p Left TKA    Pt resting in bed.  No complaints.    VSS Afebrile.  Hgb stable 12.5    Dressing c.d.i, cryotherapy  Calves soft and supple; No pain with passive stretch  Sensation and motor intact  SCDs for mechanical DVT proph while in bed     PLAN:  1) PT BID, really needs to work on knee extension - educated him about keeping knee extended and not externally rotated while in bed.  2) Plan d/c home with family today   Londell MohMICHELE M Hicks Feick, NP

## 2014-02-20 NOTE — Progress Notes (Signed)
Reviewed discharge instructions, prescriptions and side effects with patient and his wife. Answered all questions and provided contact information for future questions.

## 2014-02-20 NOTE — Discharge Summary (Signed)
Ortho Discharge Summary    Patient ID:  Walter Molina  [id number]  male  73 y.o.  04/21/1940    Admit date: 02/18/2014    Discharge date: 02/20/2014    Admitting Physician: Janece Canterburyobert D Wills, MD     Consulting Physician(s):   Treatment Team: Attending Provider: Janece Canterburyobert D Wills, MD; Nurse Practitioner: Londell MohMichele M Macario Shear, NP; Care Manager: Heath LarkAshley W Seade, MSW    Date of Surgery:   02/18/2014     Preoperative Diagnosis:  LEFT KNEE OSTEO ARTHROSIS    Postoperative Diagnosis:   LEFT KNEE OSTEO ARTHROSIS    Procedure(s):     LEFT TOTAL KNEE ARTHROPLASTY WITH NAVIGATION     Anesthesia Type:   General     Surgeon: Janece Canterburyobert D Wills, MD                            HPI:  Pt is a 73 y.o. male who has a history of LEFT KNEE OSTEO ARTHROSIS  with pain and limitations of activities of daily living who presents at this time for a left TKA following the failure of conservative management.    PMH:   Past Medical History   Diagnosis Date   ??? Hypertension    ??? GERD (gastroesophageal reflux disease)    ??? Arthritis      knees   ??? Other ill-defined conditions(799.89)      diverticular disease/colon   ??? Other and unspecified hyperlipidemia    ??? Seasonal allergic rhinitis    ??? Type II or unspecified type diabetes mellitus without mention of complication, not stated as uncontrolled (HCC)      borderline   ??? Ill-defined condition      high cholesterol       Medications upon admission :   Prior to Admission Medications   Prescriptions Last Dose Informant Patient Reported? Taking?   ONETOUCH ULTRA TEST strip   No No   Sig: Test up to once daily.  Dx 250.00   ONETOUCH ULTRASOFT LANCETS misc   No No   Sig: TEST ONCE DAILY. DX 250.00   losartan-hydrochlorothiazide (HYZAAR) 50-12.5 mg per tablet 02/17/2014 at 0630  No Yes   Sig: TAKE 1 TABLET BY MOUTH EVERYDAY   metFORMIN ER (GLUCOPHAGE XR) 500 mg tablet 02/17/2014 at 1830  No Yes   Sig: Take 1 tab with dinner x 1 week.  If tolerating, increase to 2 tabs at dinner    Patient taking differently: 1,000 mg. Take 1 tab with dinner x 1 week.  If tolerating, increase to 2 tabs at dinner   naproxen sodium (ALEVE) 220 mg tablet 02/12/2014 at Unknown time Self Yes Yes   Sig: Take 220 mg by mouth as needed.   pantoprazole (PROTONIX) 40 mg tablet 02/18/2014 at Unknown time  Yes Yes   Sig: Take 40 mg by mouth daily.   simvastatin (ZOCOR) 20 mg tablet 02/17/2014 at 1800  No Yes   Sig: TAKE 1 TABLET BY MOUTH NIGHTLY      Facility-Administered Medications: None        Allergies:    Allergies   Allergen Reactions   ??? Penicillins Rash        Hospital Course:  The patient underwent surgery.  Complications:  None; patient tolerated the procedure well. Was taken to the PACU in stable condition and then transferred to the ortho floor.      Perioperative Antibiotics:  vancomycin  Postoperative Pain Management:  Oxycodone      DVT Prophylaxis: ASA    Postoperative transfusions:    Number of units banked PRBCs =   none     Post Op complications: none    Hemoglobin at discharge:    Lab Results   Component Value Date/Time    HGB 12.5 02/20/2014 03:08 AM    INR 1.0 02/03/2014 10:08 AM       Dressing was changed on POD # 1.  Incision - clean, dry and intact. No significant erythema or swelling. Neurovascular exam found to be within normal limits. Wound appears to be healing without any evidence of infection.      Physical Therapy started on the day following surgery and participated in bed mobility, transfers and ambulation.        Gait:  Gait  Base of Support: Widened  Speed/Cadence: Pace decreased (<100 feet/min)  Step Length: Left shortened, Right shortened  Gait Abnormalities: Antalgic, Decreased step clearance (decreased heel strike and knee extension on left)  Ambulation - Level of Assistance: Contact guard assistance  Distance (ft): 200 Feet (ft)  Assistive Device: Gait belt, Walker, rolling  Rail Use: Both  Stairs - Level of Assistance: Stand-by asssistance  Number of Stairs Trained: 4                    Discharged to: Home.    Condition on Discharge:   stable    Discharge instructions:  - Anticoagulate with ASA  - Take pain medications as prescribed  - Resume pre hospital diet      - Discharge activity: activity as tolerated  - Ambulate with Walker;    - Weight bearing status WBAT  - Wound Care Keep wound clean and dry.  See discharge instruction sheet.  - Staples to be removed 10 days after surgery            -DISCHARGE MEDICATION LIST     Current Discharge Medication List      START taking these medications    Details   acetaminophen (TYLENOL) 325 mg tablet Take 2 Tabs by mouth every six (6) hours for 14 days.  Qty: 112 Tab, Refills: 0      aspirin (ASPIRIN) 325 mg tablet Take 1 Tab by mouth two (2) times a day for 30 days.  Qty: 60 Tab, Refills: 0      oxyCODONE IR (ROXICODONE) 5 mg immediate release tablet Take 1-2 Tabs by mouth every three (3) hours as needed for Pain. Max Daily Amount: 80 mg.  Qty: 80 Tab, Refills: 0      polyethylene glycol (MIRALAX) 17 gram packet Take 1 Packet by mouth daily as needed (constipation) for up to 10 days.  Qty: 10 Packet, Refills: 0         CONTINUE these medications which have NOT CHANGED    Details   simvastatin (ZOCOR) 20 mg tablet TAKE 1 TABLET BY MOUTH NIGHTLY  Qty: 90 Tab, Refills: 3      pantoprazole (PROTONIX) 40 mg tablet Take 40 mg by mouth daily.      losartan-hydrochlorothiazide (HYZAAR) 50-12.5 mg per tablet TAKE 1 TABLET BY MOUTH EVERYDAY  Qty: 90 Tab, Refills: 3      metFORMIN ER (GLUCOPHAGE XR) 500 mg tablet Take 1 tab with dinner x 1 week.  If tolerating, increase to 2 tabs at dinner  Qty: 60 Tab, Refills: 11      ONETOUCH ULTRASOFT LANCETS misc TEST ONCE DAILY.  DX 250.00  Qty: 100 Each, Refills: 3    Comments: DX Code Needed .      ONETOUCH ULTRA TEST strip Test up to once daily.  Dx 250.00  Qty: 35 Strip, Refills: 11         STOP taking these medications       naproxen sodium (ALEVE) 220 mg tablet Comments:   Reason for Stopping:             per medical continuation form      -Follow up in office in 2 weeks      Signed:  Tarri Abernethy. Aura Dials, MSN, ACNP, ONP-C  Orthopaedic Nurse Practitioner    02/20/2014  10:04 AM

## 2014-02-20 NOTE — Progress Notes (Signed)
Pt is s 73 year old male admitted for an elective L TKA. Prior to admission pt was independent at home.    Pt will require home health PT post discharge. FOC provided and a referral placed with [facility].  [facility] to start services with pt on tomorrow.    Care Management Interventions  PCP Verified by CM?: Yes  Mode of Transport at Discharge: Other (see comment) (spouse)  Care Management Consult: Yes  Discharge Durable Medical Equipment: Yes (rolling walker and 3 in 1 commode)  Physical Therapy Consult: Yes  Occupational Therapy Consult: Yes  Current Support Network: Lives with Spouse  Confirm Follow Up Transport: Family  Plan discussed with Pt/Family/Caregiver: Yes  Discharge Location  Discharge Placement: Home with home health ([facility] home [facility])     Heath LarkAshley W Seade, MSW  [phone]

## 2014-02-22 DIAGNOSIS — G8918 Other acute postprocedural pain: Secondary | ICD-10-CM

## 2014-02-22 NOTE — ED Notes (Signed)
Pt had L knee surgery on Tuesday, wife noticed today that pt had redness on L uppper leg. Medial and posterior aspects of upper L leg is warm to touch, red, and firm

## 2014-02-22 NOTE — ED Provider Notes (Signed)
HPI Comments: Walter Molina is a 73 y.o. Male with pmhx significant for arthritis who presents to ED via EMS with complaint of redness to anterior and posterior aspects of L knee. Pt had a left total knee arthroplasty with navigation by Dr. Janalyn Harder on 02/18/2014. Pt also notes that he has been sitting on his recliner frequently after his surgery. Pt also c/o diarrhea, but notes that he is taking pain medications. . He specifically denies any fevers, chills, nausea, vomiting, chest pain, shortness of breath, headache, sweating or weight loss.    General Surgery: Janalyn Harder, MD    PMHx significant for HTN, arthritis  PSHx significant for L total knee arthroplasty with navigation  Social Hx: - tobacco; + EtOH (rarely); - illegal drugs    There are no other complaints, changes, or physical findings at this time.  Written by Idamae Lusher, ED Scribe, as dictated by Raynelle Bring. [name], MD      The history is provided by the patient and the spouse.      Past Medical History   Diagnosis Date   ??? Hypertension    ??? GERD (gastroesophageal reflux disease)    ??? Arthritis      knees   ??? Other ill-defined conditions(799.89)      diverticular disease/colon   ??? Other and unspecified hyperlipidemia    ??? Seasonal allergic rhinitis    ??? Type II or unspecified type diabetes mellitus without mention of complication, not stated as uncontrolled (HCC)      borderline   ??? Ill-defined condition      high cholesterol   ;     Past Surgical History   Procedure Laterality Date   ??? Pr abdomen surgery proc unlisted       colectomy/12 years ago   ??? Hx appendectomy     ??? Colonoscopy  10/10/2008         ??? Hx cataract removal Bilateral 9/13   ??? Hx tonsillectomy     ??? Hx orthopaedic       bunionectomy b/l   ??? Hx orthopaedic  07/24/13     right 2nd toe had bone spur removed   ??? Hx orthopaedic  02/18/14     LEFT TOTAL KNEE ARTHROPLASTY WITH NAVIGATION         Family History   Problem Relation Age of Onset   ??? Diabetes Father    ??? Stroke Father 14    ??? Diabetes Other      cousin   ??? Heart Disease Neg Hx    ??? Hypertension Mother         History     Social History   ??? Marital Status: MARRIED     Spouse Name: N/A     Number of Children: N/A   ??? Years of Education: N/A     Occupational History   ??? Not on file.     Social History Main Topics   ??? Smoking status: Never Smoker    ??? Smokeless tobacco: Not on file   ??? Alcohol Use: Yes      Comment: very rarely may have a beer or glass of wine   ??? Drug Use: No   ??? Sexual Activity: Not on file     Other Topics Concern   ??? Not on file     Social History Narrative    Lives in [place] with wife of 9 years.  Has 2 sons from a previous marriage.  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    Likes to work in the yard.         ALLERGIES: Penicillins    Review of Systems   Constitutional: Negative.  Negative for fever, chills, activity change, appetite change, fatigue and unexpected weight change.   HENT: Negative.  Negative for congestion, hearing loss, rhinorrhea, sneezing and voice change.    Eyes: Negative.  Negative for pain and visual disturbance.   Respiratory: Negative.  Negative for apnea, cough, choking, chest tightness and shortness of breath.    Cardiovascular: Negative.  Negative for chest pain and palpitations.   Gastrointestinal: Positive for diarrhea. Negative for nausea, vomiting, abdominal pain, blood in stool and abdominal distention.   Genitourinary: Negative.  Negative for urgency, frequency, flank pain and difficulty urinating.        No discharge   Musculoskeletal: Negative.  Negative for myalgias, back pain, arthralgias and neck stiffness.   Skin: Positive for color change (redness to anterior and posterior aspects of L knee). Negative for rash.   Neurological: Negative.  Negative for dizziness, seizures, syncope, speech difficulty, weakness, numbness and headaches.   Hematological: Negative for adenopathy.    Psychiatric/Behavioral: Negative.  Negative for suicidal ideas, behavioral problems, dysphoric mood and agitation. The patient is not nervous/anxious.    All other systems reviewed and are negative.      Filed Vitals:    02/22/14 2223   BP: 139/63   Pulse: 73   Temp: 98.7 ??F (37.1 ??C)   Resp: 16   Height: 5\' 11"  (1.803 m)   Weight: 101.606 kg (224 lb)   SpO2: 98%            Physical Exam   Constitutional: He is oriented to person, place, and time. He appears well-developed and well-nourished. No distress.   HENT:   Head: Normocephalic and atraumatic.   Mouth/Throat: Oropharynx is clear and moist. No oropharyngeal exudate.   Eyes: Conjunctivae and EOM are normal. Pupils are equal, round, and reactive to light. Right eye exhibits no discharge. Left eye exhibits no discharge.   Neck: Normal range of motion. Neck supple.   Cardiovascular: Normal rate, regular rhythm and intact distal pulses.  Exam reveals no gallop and no friction rub.    No murmur heard.  Pulmonary/Chest: Effort normal and breath sounds normal. No respiratory distress. He has no wheezes. He has no rales. He exhibits no tenderness.   Abdominal: Soft. Bowel sounds are normal. He exhibits no distension and no mass. There is no tenderness. There is no rebound and no guarding.   Musculoskeletal: Normal range of motion. He exhibits no edema.   Lymphadenopathy:     He has no cervical adenopathy.   Neurological: He is alert and oriented to person, place, and time. No cranial nerve deficit. Coordination normal.   Skin: Skin is warm and dry.   Hematoma to L popliteal area   Psychiatric: He has a normal mood and affect.   Nursing note and vitals reviewed.  Written by Idamae LusherKevin Desai, ED Scribe, as dictated by Raynelle Bringarlton L. Hinton RaoStadler, MD      MDM  Number of Diagnoses or Management Options  Post-op pain:   Diagnosis management comments: DDx: Post-operative changes        Amount and/or Complexity of Data Reviewed  Clinical lab tests: ordered and reviewed   Obtain history from someone other than the patient: yes (wife)  Review and summarize past medical records: yes  Patient Progress  Patient progress: stable      Procedures    Chief Complaint   Patient presents with   ??? Skin Problem       10:21 PM  The patients presenting problems have been discussed, and they are in agreement with the care plan formulated and outlined with them.  I have encouraged them to ask questions as they arise throughout their visit.    LABS REVIEWED:  Recent Results (from the past 24 hour(s))   CBC WITH AUTOMATED DIFF    Collection Time: 02/22/14 10:31 PM   Result Value Ref Range    WBC 8.2 4.1 - 11.1 K/uL    RBC 3.86 (L) 4.10 - 5.70 M/uL    HGB 12.0 (L) 12.1 - 17.0 g/dL    HCT 65.7 (L) 84.6 - 50.3 %    MCV 92.5 80.0 - 99.0 FL    MCH 31.1 26.0 - 34.0 PG    MCHC 33.6 30.0 - 36.5 g/dL    RDW 96.2 95.2 - 84.1 %    PLATELET 155 150 - 400 K/uL    NEUTROPHILS 70 32 - 75 %    LYMPHOCYTES 18 12 - 49 %    MONOCYTES 11 5 - 13 %    EOSINOPHILS 1 0 - 7 %    BASOPHILS 0 0 - 1 %    ABS. NEUTROPHILS 5.8 1.8 - 8.0 K/UL    ABS. LYMPHOCYTES 1.5 0.8 - 3.5 K/UL    ABS. MONOCYTES 0.9 0.0 - 1.0 K/UL    ABS. EOSINOPHILS 0.1 0.0 - 0.4 K/UL    ABS. BASOPHILS 0.0 0.0 - 0.1 K/UL       VITAL SIGNS:  Patient Vitals for the past 12 hrs:   Temp Pulse Resp BP SpO2   02/22/14 2223 98.7 ??F (37.1 ??C) 73 16 139/63 mmHg 98 %       DIAGNOSIS:    1. Post-op pain        PLAN:  Current Discharge Medication List      CONTINUE these medications which have NOT CHANGED    Details   acetaminophen (TYLENOL) 325 mg tablet Take 2 Tabs by mouth every six (6) hours for 14 days.  Qty: 112 Tab, Refills: 0      aspirin (ASPIRIN) 325 mg tablet Take 1 Tab by mouth two (2) times a day for 30 days.  Qty: 60 Tab, Refills: 0      oxyCODONE IR (ROXICODONE) 5 mg immediate release tablet Take 1-2 Tabs by mouth every three (3) hours as needed for Pain. Max Daily Amount: 80 mg.  Qty: 80 Tab, Refills: 0       polyethylene glycol (MIRALAX) 17 gram packet Take 1 Packet by mouth daily as needed (constipation) for up to 10 days.  Qty: 10 Packet, Refills: 0      simvastatin (ZOCOR) 20 mg tablet TAKE 1 TABLET BY MOUTH NIGHTLY  Qty: 90 Tab, Refills: 3      pantoprazole (PROTONIX) 40 mg tablet Take 40 mg by mouth daily.      losartan-hydrochlorothiazide (HYZAAR) 50-12.5 mg per tablet TAKE 1 TABLET BY MOUTH EVERYDAY  Qty: 90 Tab, Refills: 3      ONETOUCH ULTRASOFT LANCETS misc TEST ONCE DAILY. DX 250.00  Qty: 100 Each, Refills: 3    Comments: DX Code Needed .      metFORMIN ER (GLUCOPHAGE XR) 500 mg tablet Take 1 tab with dinner x 1 week.  If tolerating, increase to 2  tabs at dinner  Qty: 60 Tab, Refills: 11      ONETOUCH ULTRA TEST strip Test up to once daily.  Dx 250.00  Qty: 35 Strip, Refills: 11           Follow-up Information     Follow up With Details Comments Contact Info    Not On File   Not On File (58) Patient has a PCP but that physician is not listed in [facility].      [facility] Call in 2 days  [address]  [place] [id number]  [phone]          ED COURSE: The patients hospital course has been uncomplicated.    10:51 PM   Lillie Fragminonald L Mohl's  results have been reviewed with him.  He has been counseled regarding his diagnosis.  He verbally conveys understanding and agreement of the signs, symptoms, diagnosis, treatment and prognosis and additionally agrees to follow up as recommended with Dr. Not On File in 24 - 48 hours.  He also agrees with the care-plan and conveys that all of his questions have been answered.  I have also put together some discharge instructions for him that include: 1) educational information regarding their diagnosis, 2) how to care for their diagnosis at home, as well a 3) list of reasons why they would want to return to the ED prior to their follow-up appointment, should their condition change.  Written by Idamae LusherKevin Desai, ED Scribe, as dictated by Raynelle Bringarlton L. Hinton RaoStadler, MD

## 2014-02-22 NOTE — ED Notes (Signed)
Dr. Stadler reviewed discharge instructions with the patient.  The patient verbalized understanding.

## 2014-02-22 NOTE — ED Notes (Signed)
Patient was wheeled out of ED in wheelchair.

## 2014-02-23 ENCOUNTER — Inpatient Hospital Stay
Admit: 2014-02-23 | Discharge: 2014-02-23 | Disposition: A | Discharge status: Home / Self Care | Payer: MEDICARE | Attending: Emergency Medicine

## 2014-02-23 LAB — CBC WITH AUTOMATED DIFF
ABS. BASOPHILS: 0 10*3/uL (ref 0.0–0.1)
ABS. EOSINOPHILS: 0.1 10*3/uL (ref 0.0–0.4)
ABS. LYMPHOCYTES: 1.5 10*3/uL (ref 0.8–3.5)
ABS. MONOCYTES: 0.9 10*3/uL (ref 0.0–1.0)
ABS. NEUTROPHILS: 5.8 10*3/uL (ref 1.8–8.0)
BASOPHILS: 0 % (ref 0–1)
EOSINOPHILS: 1 % (ref 0–7)
HCT: 35.7 % — ABNORMAL LOW (ref 36.6–50.3)
HGB: 12 g/dL — ABNORMAL LOW (ref 12.1–17.0)
LYMPHOCYTES: 18 % (ref 12–49)
MCH: 31.1 PG (ref 26.0–34.0)
MCHC: 33.6 g/dL (ref 30.0–36.5)
MCV: 92.5 FL (ref 80.0–99.0)
MONOCYTES: 11 % (ref 5–13)
NEUTROPHILS: 70 % (ref 32–75)
PLATELET: 155 10*3/uL (ref 150–400)
RBC: 3.86 M/uL — ABNORMAL LOW (ref 4.10–5.70)
RDW: 13.5 % (ref 11.5–14.5)
WBC: 8.2 10*3/uL (ref 4.1–11.1)

## 2014-02-23 NOTE — Op Note (Signed)
Name:      Bonney RousselGREEN, Ricco L                                          Surgeon:        Janece Canterburyobert D Dymond Gutt, MD  Account #: [id number]                 Surgery Date:   02/18/2014  DOB:       [birth year]  Age:       73                           Location:                                 OPERATIVE REPORT      PREOPERATIVE DIAGNOSIS: Left knee severe end-stage osteoarthritis.    POSTOPERATIVE DIAGNOSIS: Left knee severe end-stage osteoarthritis.    PROCEDURES PERFORMED: Left total knee arthroplasty with imageless  navigation.    SURGEON: Barbette Hairobert D. Patrecia PaceWills, MD.    ANESTHESIA: General.    COMPLICATIONS: None.    DISPOSITION: Stable to recovery room.    ESTIMATED BLOOD LOSS: 100 mL.    SPECIMENS REMOVED: None.    INDICATIONS: This 73 year old gentleman failed conservative management for  left knee severe end-stage osteoarthritis as demonstrated on x-rays. He  failed conservative management with oral anti-inflammatories, as well as  cortisone injection. His pain and disability have progressed to the point  that his normal daily activities have become severely curtailed. The risks,  benefits and alternatives of surgery were explained in detail, he agreed to  proceed.    DESCRIPTION OF PROCEDURE: The patient was taken to the operating room, laid  supine on table. General anesthetic was administered. Vancomycin was given  preoperatively per protocol and a tourniquet was applied to the left thigh  above the knee, and the knee was then prepped and draped free in the usual  sterile fashion and time-out was called.    Following time-out the limb was exsanguinated and tourniquet was inflated  to 275 mmHg. A midline incision was carried out, followed by a medial  parapatellar approach. The patella was everted and a small amount of the  fat pad was resected, 10 mm was resected from the undersurface of the  patella, it was placed in a non-everted position and swept laterally.   Retractors were placed medially and laterally in the gutters to allow  exposure to the distal femur. The patient did have a flexion contracture  preoperatively. The distal femoral tracker was pinned in place and the  appropriate data points entered into the computer. Then a distal femoral  cut was carried out at 10 mm from the high side to account for his flexion  contracture at about 15degrees of flexion. Following this a chamfer guide  was then placed in 3 degrees of external rotation, referencing the  posterior condyles, and anterior and posterior chamfer cuts were carried  out for a size 6 femur. The ACL was resected, the tibia was subluxed  forward, and a tibial tracker was pinned in place and the appropriate data  points entered into the computer here as well. A proximal tibial cut was  then carried out at 9 mm from the high side at neutral varus-valgus  angulation in 3 degrees posterior slope. A laminar spreader was placed and  the medial and lateral menisci removed and posterior release carried out.  The posterior capsule was infiltrated with Marcaine, morphine and Toradol  with epinephrine mixture. Bleeders were also coagulated using Bovie cautery  at this time as well. This was medially and laterally. Following this the  knee was trialed with a 6 femur, a 6 tibia, with a 9 mm cruciate-retaining  insert with full extension and flexion and good stability throughout. The  lug holes were drilled for the femur and the tray was pinned in place.  There was good bone quality, and therefore cementless components were  utilized. A punch was used to prepare for a cementless tibial keel.    Following this a size 6 cementless tibial insert was press fit into  position. A size 9 mm cruciate-retaining poly was placed and then a size 6  cruciate-retaining femoral component was placed. The knee was brought into  extension and 3 drill holes were placed in the patella to allow compression   of a size 38 x 11 mm patellar component. The knee was then put through a  range of motion again with full extension and flexion and good stability  throughout. The wound was copiously irrigated with pulsatile lavage. The  tourniquet was let down during implantation. Bleeders were coagulated as  encountered. There was no significant bleeding at the end. The extensor  mechanism was closed with interrupted #1 Vicryl, the subcu with 2-0 Vicryl,  and the skin with staples. Sterile dressing was applied, followed by a  compressive Ace wrap. The patient tolerated the procedure well, was stable  to recovery room in satisfactory condition.          Janece Canterburyobert D Shoua Ressler, MD    cc:   Janece Canterburyobert D Avonda Toso, MD        RDW/wmx; D: 02/23/2014 11:43 A; T: 02/23/2014 10:22 P; Doc# [id number]; Job#  [id number]

## 2014-02-23 NOTE — Op Note (Signed)
Name:      Walter RousselGREEN, Walter Molina                                          Surgeon:        Janece Canterburyobert D Burnice Vassel, MD  Account #: [id number]                 Surgery Date:   02/18/2014  DOB:       05/12/1940  Age:       73                           Location:                                 OPERATIVE REPORT      PREOPERATIVE DIAGNOSIS: Left knee severe end-stage osteoarthritis.    POSTOPERATIVE DIAGNOSIS: Left knee severe end-stage osteoarthritis.    PROCEDURES PERFORMED: Left total knee arthroplasty with imageless  navigation.    SURGEON: Barbette Hairobert D. Patrecia PaceWills, MD.    ANESTHESIA: General.    COMPLICATIONS: None.    DISPOSITION: Stable to recovery room.    ESTIMATED BLOOD LOSS: 100 mL.    SPECIMENS REMOVED: None.    INDICATIONS: This 73 year old gentleman failed conservative management for  left knee severe end-stage osteoarthritis as demonstrated on x-rays. He  failed conservative management with oral anti-inflammatories, as well as  cortisone injection. His pain and disability have progressed to the point  that his normal daily activities have become severely curtailed. The risks,  benefits and alternatives of surgery were explained in detail, he agreed to  proceed.    DESCRIPTION OF PROCEDURE: The patient was taken to the operating room, laid  supine on table. General anesthetic was administered. Vancomycin was given  preoperatively per protocol and a tourniquet was applied to the left thigh  above the knee, and the knee was then prepped and draped free in the usual  sterile fashion and time-out was called.    Following time-out the limb was exsanguinated and tourniquet was inflated  to 275 mmHg. A midline incision was carried out, followed by a medial  parapatellar approach. The patella was everted and a small amount of the  fat pad was resected, 10 mm was resected from the undersurface of the  patella, it was placed in a non-everted position and swept laterally.  Retractors were placed medially and laterally in the gutters to  allow  exposure to the distal femur. The patient did have a flexion contracture  preoperatively. The distal femoral tracker was pinned in place and the  appropriate data points entered into the computer. Then a distal femoral  cut was carried out at 10 mm from the high side to account for his flexion  contracture at about 15degrees of flexion. Following this a chamfer guide  was then placed in 3 degrees of external rotation, referencing the  posterior condyles, and anterior and posterior chamfer cuts were carried  out for a size 6 femur. The ACL was resected, the tibia was subluxed  forward, and a tibial tracker was pinned in place and the appropriate data  points entered into the computer here as well. A proximal tibial cut was  then carried out at 9 mm from the high side at neutral varus-valgus  angulation in 3 degrees posterior slope. A laminar spreader was placed and  the medial and lateral menisci removed and posterior release carried out.  The posterior capsule was infiltrated with Marcaine, morphine and Toradol  with epinephrine mixture. Bleeders were also coagulated using Bovie cautery  at this time as well. This was medially and laterally. Following this the  knee was trialed with a 6 femur, a 6 tibia, with a 9 mm cruciate-retaining  insert with full extension and flexion and good stability throughout. The  lug holes were drilled for the femur and the tray was pinned in place.  There was good bone quality, and therefore cementless components were  utilized. A punch was used to prepare for a cementless tibial keel.    Following this a size 6 cementless tibial insert was press fit into  position. A size 9 mm cruciate-retaining poly was placed and then a size 6  cruciate-retaining femoral component was placed. The knee was brought into  extension and 3 drill holes were placed in the patella to allow compression  of a size 38 x 11 mm patellar component. The knee was then put through a  range of motion again  with full extension and flexion and good stability  throughout. The wound was copiously irrigated with pulsatile lavage. The  tourniquet was let down during implantation. Bleeders were coagulated as  encountered. There was no significant bleeding at the end. The extensor  mechanism was closed with interrupted #1 Vicryl, the subcu with 2-0 Vicryl,  and the skin with staples. Sterile dressing was applied, followed by a  compressive Ace wrap. The patient tolerated the procedure well, was stable  to recovery room in satisfactory condition.          Nahomy Limburg D Teagan Ozawa, MD    cc:   JJanece Canterburyanece Canterburyobert D Shanece Cochrane, MD        RDW/wmx; D: 02/23/2014 11:43 A; T: 02/23/2014 10:22 P; Doc# [id number]; Job#  [id number]

## 2014-04-07 MED ORDER — METFORMIN SR 500 MG 24 HR TABLET
500 mg | ORAL_TABLET | ORAL | Status: DC
Start: 2014-04-07 — End: 2014-11-14

## 2014-05-20 ENCOUNTER — Encounter

## 2014-05-20 NOTE — Telephone Encounter (Addendum)
-----   Message from Carylon Perchesouglas A Johnson, MD sent at 05/19/2014  8:29 AM EST -----      ----- Message -----     From: Carylon Perchesouglas A Johnson, MD     Sent: 05/19/2014       To: Carylon Perchesouglas A Johnson, MD    Mail her a lab slip  ---------------    Printed and mailed.

## 2014-06-05 LAB — METABOLIC PANEL, BASIC
BUN/Creatinine ratio: 18 (ref 10–22)
BUN: 16 mg/dL (ref 8–27)
CO2: 24 mmol/L (ref 18–29)
Calcium: 9.5 mg/dL (ref 8.6–10.2)
Chloride: 102 mmol/L (ref 97–108)
Creatinine: 0.88 mg/dL (ref 0.76–1.27)
GFR est AA: 97 mL/min/{1.73_m2} (ref >59)
GFR est non-AA: 84 mL/min/{1.73_m2} (ref >59)
Glucose: 187 mg/dL — ABNORMAL HIGH (ref 65–99)
Potassium: 4.8 mmol/L (ref 3.5–5.2)
Sodium: 141 mmol/L (ref 134–144)

## 2014-06-05 LAB — HEMOGLOBIN A1C WITH EAG: Hemoglobin A1c: 7.1 % — ABNORMAL HIGH (ref 4.8–5.6)

## 2014-06-05 LAB — LIPID PANEL
Cholesterol, total: 151 mg/dL (ref 100–199)
HDL Cholesterol: 34 mg/dL — ABNORMAL LOW (ref >39)
LDL, calculated: 81 mg/dL (ref 0–99)
Triglyceride: 181 mg/dL — ABNORMAL HIGH (ref 0–149)
VLDL, calculated: 36 mg/dL (ref 5–40)

## 2014-06-05 LAB — CVD REPORT

## 2014-06-06 ENCOUNTER — Ambulatory Visit: Admit: 2014-06-06 | Payer: MEDICARE | Attending: Internal Medicine | Primary: Family Medicine

## 2014-06-06 DIAGNOSIS — IMO0002 Reserved for concepts with insufficient information to code with codable children: Secondary | ICD-10-CM

## 2014-06-06 NOTE — Progress Notes (Signed)
Chief Complaint   Patient presents with   ??? Diabetes     [facility] in [place]  is the pcp and pharmacy confirmed     History of Present Illness: Walter Molina is a 74 y.o. male here for follow up of diabetes. Weight up 2.5 lbs since last visit in 9/15.  Had left total knee replacement in 11/15 and recovered from this.  Still taking 2 tabs of metformin together after dinner.  Fasting sugars are in the 160s and did see a reading of 182 after dinner and still 174 the next morning.  No readings under 150 in the morning.  No readings over 200.  Doesn't miss the metformin.  Still getting simvastatin every night and losartan too.  Hasn't been exercising as his wife had an ankle injury and also broke her collar bone but plans to get back to the gym this spring.    Current Outpatient Prescriptions   Medication Sig   ??? metFORMIN ER (GLUCOPHAGE XR) 500 mg tablet Take 1 tab with dinner x 1 week.  If tolerating, increase to 2 tabs at dinner   ??? simvastatin (ZOCOR) 20 mg tablet TAKE 1 TABLET BY MOUTH NIGHTLY   ??? pantoprazole (PROTONIX) 40 mg tablet Take 40 mg by mouth daily.   ??? losartan-hydrochlorothiazide (HYZAAR) 50-12.5 mg per tablet TAKE 1 TABLET BY MOUTH EVERYDAY   ??? ONETOUCH ULTRASOFT LANCETS misc TEST ONCE DAILY. DX 250.00   ??? ONETOUCH ULTRA TEST strip Test up to once daily.  Dx 250.00     No current facility-administered medications for this visit.     Allergies   Allergen Reactions   ??? Penicillins Rash     Review of Systems:  - Eyes: no blurry vision or double vision  - Cardiovascular: no chest pain  - Respiratory: no shortness of breath  - Musculoskeletal: no myalgias  - Neurological: no numbness/tingling in extremities    Physical Examination:  - Blood pressure 135/73, pulse 65, height  (1.803 m), weight 225 lb 1.6 oz (102.105 kg).  - General: pleasant, no distress, good eye contact   - Neck: no carotid bruits  - Cardiovascular: regular, normal rate, nl s1 and s2, no m/r/g,    - Respiratory: clear bilaterally  - Integumentary: no edema,   - Psychiatric: normal mood and affect         Diabetic foot exam performed by Carylon Perches, MD     Measurement  Response Nurse Comment Physician Comment   Monofilament  R - normal sensation with micro filament  L - normal sensation with micro filament     Pulse DP R - 2+ (normal)  L - 2+ (normal)     Vibration R - decreased  L - decreased     Structural deformity R - None  L - None     Skin Integrity / Deformity R - Mild - callus  L - Mild - callus        Reviewed by:               Data Reviewed:   Component      Latest Ref Rng 06/04/2014 06/04/2014 06/04/2014           8:06 AM  8:06 AM  8:06 AM   Glucose      65 - 99 mg/dL  098 (H)    BUN      8 - 27 mg/dL  16    Creatinine  0.76 - 1.27 mg/dL  5.780.88    GFR est non-AA      >59 mL/min/1.73  84    GFR est AA      >59 mL/min/1.73  97    BUN/Creatinine ratio      10 - 22  18    Sodium      134 - 144 mmol/L  141    Potassium      3.5 - 5.2 mmol/L  4.8    Chloride      97 - 108 mmol/L  102    CO2      18 - 29 mmol/L  24    Calcium      8.6 - 10.2 mg/dL  9.5    Cholesterol, total      100 - 199 mg/dL   469151   Triglyceride      0 - 149 mg/dL   629181 (H)   HDL Cholesterol      >39 mg/dL   34 (L)   VLDL, calculated      5 - 40 mg/dL   36   LDL, calculated      0 - 99 mg/dL   81   Hemoglobin B2WA1c, (calculated)      4.8 - 5.6 % 7.1 (H)         Assessment/Plan:     1. 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  A1c back up due to less activity over the winter.  Will temporarily go up on his metformin.  - cont metformin ER 500 mg 2 tabs at dinner and add 1 tab in am for the next month  - check bs 4 times per week   - foot exam done 2/16  - optho UTD 9/15  - microalbumin nl 5/15  - check Hgb A1c and cmp and microalbumin prior to next visit     2. Hypertension (401.9AJ) his BP was at goal < 140/90  - cont hyzaar 50/12.5 mg daily     3. 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  LDL 79 in September 2011 and 86 in January 2012 and 83 in 9/12 and 85 in 7/13 and 84 in 12/13 and 88 in 6/14.  LDL 84 in 5/15 but TG up to 184 due to wt gain and higher A1c.  LDL 87 and TG 127 in 9/15 with wt loss and lower A1c.  LDL 81 and TG 181 in 2/16 with higher A1c.  - cont simva 20 mg daily  - check lipids prior to next visit       Patient Instructions   1) For the next month, take 1 tab of metformin in the morning and 2 tabs with dinner.  If your morning blood sugars are going below 140, then try going back to just 2 tabs with dinner and focus on diet and exercise to keep your sugars under 140.  If this isn't working, then go back to 1 tab in am and 2 tabs in pm.    2) Let me know  if you need me to rewrite the prescription for 3 tabs daily so you don't run out.    3) Your cholesterol was stable since last visit.    4) Your blood pressure is at goal.      Follow-up Disposition:  Return in about 5 months (around 11/04/2014).    Copy sent to:  [facility] [phone]

## 2014-06-06 NOTE — Patient Instructions (Signed)
1) For the next month, take 1 tab of metformin in the morning and 2 tabs with dinner.  If your morning blood sugars are going below 140, then try going back to just 2 tabs with dinner and focus on diet and exercise to keep your sugars under 140.  If this isn't working, then go back to 1 tab in am and 2 tabs in pm.    2) Let me know if you need me to rewrite the prescription for 3 tabs daily so you don't run out.    3) Your cholesterol was stable since last visit.    4) Your blood pressure is at goal.

## 2014-08-04 MED ORDER — BLOOD SUGAR DIAGNOSTIC TEST STRIPS
ORAL_STRIP | Status: DC
Start: 2014-08-04 — End: 2015-10-17

## 2014-09-08 ENCOUNTER — Inpatient Hospital Stay
Admit: 2014-09-08 | Discharge: 2014-09-08 | Disposition: A | Discharge status: Home / Self Care | Payer: MEDICARE | Attending: Emergency Medicine

## 2014-09-08 DIAGNOSIS — S0990XA Unspecified injury of head, initial encounter: Secondary | ICD-10-CM

## 2014-09-08 NOTE — ED Notes (Signed)
Patient complains of "dull headache" x Tues after striking back of head with sledge hammer. Patient states no LOC Denies any visual disturbances. Patient states Friday he stood up and struck his head again. Patient denies taking any blood thinners.   Patient alert and oriented x 4 Skin warm dry intact.   Patient placed in POC on stretcher Updated on plan with pending evaluation by MD

## 2014-09-08 NOTE — ED Provider Notes (Signed)
HPI Comments: Walter Molina is a 74 y.o. male presenting ambulatory to the ED c/o 1/10, dull, constant HA since 09/02/14. Pt reports that he hit himself on the top of his head with a hammer on 09/02/14 and that he hit his head on a low bar on 09/05/14. He states that he was referred to the ED by [facility] after evaluation today. Pt reports FMHx significant for heart problems and DM. Pt denies any tobacco or EtOH use. He denies any dizziness, CP, SOB, or LOC at the time of the event or since. He denies any associated neck pain, nausea, vomiting, numbness, tingling, or focal weakness.    PCP: Phys Other, MD    There are no other complaints, changes, or physical findings at this time.            Past Medical History:   Diagnosis Date   ??? Hypertension    ??? GERD (gastroesophageal reflux disease)    ??? Arthritis      knees   ??? Other ill-defined conditions(799.89)      diverticular disease/colon   ??? Other and unspecified hyperlipidemia    ??? Seasonal allergic rhinitis    ??? Type II or unspecified type diabetes mellitus without mention of complication, not stated as uncontrolled      borderline   ??? Ill-defined condition      high cholesterol       Past Surgical History:   Procedure Laterality Date   ??? Pr abdomen surgery proc unlisted       colectomy/12 years ago   ??? Hx appendectomy     ??? Colonoscopy  10/10/2008         ??? Hx cataract removal Bilateral 9/13   ??? Hx tonsillectomy     ??? Hx orthopaedic       bunionectomy b/l   ??? Hx orthopaedic  07/24/13     right 2nd toe had bone spur removed   ??? Hx orthopaedic  02/18/14     LEFT TOTAL KNEE ARTHROPLASTY WITH NAVIGATION         Family History:   Problem Relation Age of Onset   ??? Diabetes Father    ??? Stroke Father 39   ??? Diabetes Other      cousin   ??? Heart Disease Neg Hx    ??? Hypertension Mother        History     Social History   ??? Marital Status: MARRIED     Spouse Name: N/A   ??? Number of Children: N/A   ??? Years of Education: N/A     Occupational History   ??? Not on file.      Social History Main Topics   ??? Smoking status: Never Smoker    ??? Smokeless tobacco: Not on file   ??? Alcohol Use: Yes      Comment: very rarely may have a beer or glass of wine   ??? Drug Use: No   ??? Sexual Activity: Not on file     Other Topics Concern   ??? Not on file     Social History Narrative    Lives in [place] with wife of 9 years.  Has 2 sons from a previous marriage.    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    Likes to work in the yard.             ALLERGIES: Penicillins  Review of Systems   Constitutional: Negative for fever.   Eyes: Negative.    Respiratory: Negative for shortness of breath.    Cardiovascular: Negative for chest pain.   Gastrointestinal: Negative for nausea and vomiting.   Endocrine: Negative for heat intolerance.   Genitourinary: Negative for dysuria.   Musculoskeletal: Negative for neck pain.   Skin: Negative for rash.   Allergic/Immunologic: Negative for immunocompromised state.   Neurological: Positive for headaches. Negative for dizziness, weakness and numbness.        - LOC, - tingling   Hematological: Does not bruise/bleed easily.   Psychiatric/Behavioral: Negative.    All other systems reviewed and are negative.      Filed Vitals:    09/08/14 1110   BP: 160/77   Pulse: 62   Temp: 98 ??F (36.7 ??C)   Resp: 16   Height: 5\' 10"  (1.778 m)   Weight: 105.1 kg (231 lb 11.3 oz)   SpO2: 98%            Physical Exam   Constitutional: He is oriented to person, place, and time. He appears well-developed and well-nourished. No distress.   HENT:   Head: Normocephalic and atraumatic.   Eyes: EOM are normal. Pupils are equal, round, and reactive to light.   Neck: Normal range of motion. Neck supple.   Cardiovascular: Normal rate, regular rhythm and normal heart sounds.    Pulmonary/Chest: Effort normal and breath sounds normal. He has no wheezes.   Abdominal: Soft. Bowel sounds are normal. There is no tenderness.    Musculoskeletal: Normal range of motion. He exhibits no edema or tenderness.   Neurological: He is alert and oriented to person, place, and time. No cranial nerve deficit.   No sensorimotor deficits.   Skin: Skin is warm and dry.   Psychiatric: He has a normal mood and affect.   Nursing note and vitals reviewed.       MDM  Number of Diagnoses or Management Options  Closed head injury, initial encounter:   Diagnosis management comments:     DDx: closed head injury, contusion, ICH       Amount and/or Complexity of Data Reviewed  Tests in the radiology section of CPT??: ordered and reviewed  Review and summarize past medical records: yes    Patient Progress  Patient progress: stable      Procedures      IMAGING RESULTS:  CT HEAD WO CONT (Final result) Result time: 09/08/14 12:11:41   ?? Final result by Rad Results In Edi (09/08/14 12:11:41)   ?? Narrative:   ?? **Final Report**  ??    ICD Codes / Adm.Diagnosis: 161096160266 ??G89.18 / Head Injury ??head injury  Examination: ??CT HEAD WO CON ??- [id number] - Sep 08 2014 11:55AM  Accession No: ??[id number]  Reason: ??Pain      REPORT:  EXAM: ??CT HEAD WO CON    INDICATION: ?? Pain after patient hit head with hammer. Site of impact not   specified.    COMPARISON: None.    TECHNIQUE: Unenhanced CT of the head was performed using 5 mm images. Brain   and bone windows were generated.     FINDINGS:  The ventricles and sulci are normal in size, shape and configuration and   midline. There is no significant white matter disease. There is no   intracranial hemorrhage, extra-axial collection, mass, mass effect or   midline shift. ??The basilar cisterns are open. No acute infarct is   identified. The  bone windows demonstrate no abnormalities. A 2.3 cm mucous   retention cyst is present in the right maxillary sinus. Hyperdense material   is present in the anterior left ethmoid air cells. There is a cortical   defect in the medial left orbit (axial image 6)..  ????    IMPRESSION:      No acute intracranial process seen  Hyperdense material in the left anterior ethmoid sinuses could represent   hemorrhage, fungal material or desiccated mucus. Age-indeterminate fracture   of the left medial orbit.  ????    ??  ??  Signing/Reading Doctor: Monico Blitz [phone]) ??  ApprovedMonico Blitz [phone]) ??Sep 08 2014 12:09PM ?? ?? ?? ?? ?? ?? ?? ?? ?? ?? ?? ??         IMPRESSION:  1. Closed head injury, initial encounter        PLAN:  1.   Discharge Medication List as of 09/08/2014  1:02 PM      CONTINUE these medications which have NOT CHANGED    Details   glucose blood VI test strips (ONETOUCH ULTRA TEST) strip TEST UP TO ONCE DAILY. DX E11.9, Normal, Disp-50 Strip, R-11      metFORMIN ER (GLUCOPHAGE XR) 500 mg tablet Take 1 tab with dinner x 1 week.  If tolerating, increase to 2 tabs at dinner, Normal, Disp-180 Tab, R-3      simvastatin (ZOCOR) 20 mg tablet TAKE 1 TABLET BY MOUTH NIGHTLY, Normal, Disp-90 Tab, R-3      pantoprazole (PROTONIX) 40 mg tablet Take 40 mg by mouth daily., Historical Med      losartan-hydrochlorothiazide (HYZAAR) 50-12.5 mg per tablet TAKE 1 TABLET BY MOUTH EVERYDAY, Normal, Disp-90 Tab, R-3      ONETOUCH ULTRASOFT LANCETS misc TEST ONCE DAILY. DX 250.00, NormalDX Code Needed .Disp-100 Each, R-3           2.   Follow-up Information     Follow up With Details Comments Contact Info    Phys Other, MD In 2 days As needed Patient can only remember the practice name and not the physician      [facility]  If symptoms worsen [address]  [place] [address]  [phone]        Return to ED if worse     DISCHARGE NOTE:  1:03 PM  The patient is ready for discharge. The patient's signs, symptoms, diagnosis, and discharge instructions have been discussed and the patient has conveyed their understanding. The patient is to follow up as recommended or return to the ER should their symptoms worsen. Plan has been discussed and the patient is in agreement.     ATTESTATION:   This note is prepared by Coralee North, acting as Scribe for Neldon Newport, MD.    Neldon Newport, MD: The scribe's documentation has been prepared under my direction and personally reviewed by me in its entirety. I confirm that the note above accurately reflects all work, treatment, procedures, and medical decision making performed by me.

## 2014-09-08 NOTE — ED Notes (Signed)
Dr. Smith reviewed discharge instructions with the patient.  The patient verbalized understanding.  Patient ambulatory out of ED with discharge paperwork in hand.

## 2014-10-07 MED ORDER — BLOOD SUGAR DIAGNOSTIC TEST STRIPS
ORAL_STRIP | Status: DC
Start: 2014-10-07 — End: 2015-08-12

## 2014-10-23 MED ORDER — LOSARTAN-HYDROCHLOROTHIAZIDE 50 MG-12.5 MG TAB
ORAL_TABLET | ORAL | Status: DC
Start: 2014-10-23 — End: 2015-10-21

## 2014-11-12 LAB — LIPID PANEL
Cholesterol, total: 142 mg/dL (ref 100–199)
HDL Cholesterol: 32 mg/dL — ABNORMAL LOW (ref >39)
LDL, calculated: 71 mg/dL (ref 0–99)
Triglyceride: 195 mg/dL — ABNORMAL HIGH (ref 0–149)
VLDL, calculated: 39 mg/dL (ref 5–40)

## 2014-11-12 LAB — METABOLIC PANEL, COMPREHENSIVE
A-G Ratio: 2 (ref 1.1–2.5)
ALT (SGPT): 42 IU/L (ref 0–44)
AST (SGOT): 21 IU/L (ref 0–40)
Albumin: 4.3 g/dL (ref 3.5–4.8)
Alk. phosphatase: 75 IU/L (ref 39–117)
BUN/Creatinine ratio: 20 (ref 10–22)
BUN: 18 mg/dL (ref 8–27)
Bilirubin, total: 0.6 mg/dL (ref 0.0–1.2)
CO2: 22 mmol/L (ref 18–29)
Calcium: 9.4 mg/dL (ref 8.6–10.2)
Chloride: 101 mmol/L (ref 97–108)
Creatinine: 0.91 mg/dL (ref 0.76–1.27)
GFR est AA: 95 mL/min/{1.73_m2} (ref >59)
GFR est non-AA: 82 mL/min/{1.73_m2} (ref >59)
GLOBULIN, TOTAL: 2.2 g/dL (ref 1.5–4.5)
Glucose: 181 mg/dL — ABNORMAL HIGH (ref 65–99)
Potassium: 4.2 mmol/L (ref 3.5–5.2)
Protein, total: 6.5 g/dL (ref 6.0–8.5)
Sodium: 140 mmol/L (ref 134–144)

## 2014-11-12 LAB — MICROALBUMIN, UR, RAND W/ MICROALB/CREAT RATIO
Creatinine, urine random: 119.7 mg/dL
Microalb/Creat ratio (ug/mg creat.): 3.9 mg/g creat (ref 0.0–30.0)
Microalbumin, urine: 4.7 ug/mL

## 2014-11-12 LAB — HEMOGLOBIN A1C W/O EAG: Hemoglobin A1c: 6.9 % — ABNORMAL HIGH (ref 4.8–5.6)

## 2014-11-12 LAB — CVD REPORT

## 2014-11-14 ENCOUNTER — Encounter: Attending: Internal Medicine | Primary: Family Medicine

## 2014-11-14 ENCOUNTER — Ambulatory Visit: Admit: 2014-11-14 | Payer: MEDICARE | Attending: Internal Medicine | Primary: Family Medicine

## 2014-11-14 DIAGNOSIS — IMO0002 Reserved for concepts with insufficient information to code with codable children: Secondary | ICD-10-CM

## 2014-11-14 MED ORDER — METFORMIN SR 500 MG 24 HR TABLET
500 mg | ORAL_TABLET | ORAL | Status: DC
Start: 2014-11-14 — End: 2014-12-22

## 2014-11-14 NOTE — Progress Notes (Signed)
Chief Complaint   Patient presents with   ??? Diabetes     pcp is [facility] and pharmacy confirmed     History of Present Illness: Walter Molina is a 74 y.o. male here for follow up of diabetes.  Weight up 8 lbs since last visit in 2/16.  Has stayed on 1 tab of metformin in the morning and 2 tabs at night.  Fasting sugars have been in the 150s and did see one reading in the 120s after dinner.  Was walking until it got hot but then started going to the gym 3 weeks ago about 2 times a week and is doing circuit training for 45-50 minutes and hopes to increase this.  Compliant with losartan/hctz and simvastatin.    Current Outpatient Prescriptions   Medication Sig   ??? metFORMIN ER (GLUCOPHAGE XR) 500 mg tablet Take 1 tab with breakfast and 2 tabs with dinner   ??? losartan-hydrochlorothiazide (HYZAAR) 50-12.5 mg per tablet TAKE 1 TABLET BY MOUTH EVERYDAY   ??? glucose blood VI test strips (ONETOUCH ULTRA TEST) strip Test up to once daily.  Dx. E11.9   ??? glucose blood VI test strips (ONETOUCH ULTRA TEST) strip TEST UP TO ONCE DAILY. DX E11.9   ??? simvastatin (ZOCOR) 20 mg tablet TAKE 1 TABLET BY MOUTH NIGHTLY   ??? pantoprazole (PROTONIX) 40 mg tablet Take 40 mg by mouth daily.   ??? ONETOUCH ULTRASOFT LANCETS misc TEST ONCE DAILY. DX 250.00     No current facility-administered medications for this visit.     Allergies   Allergen Reactions   ??? Penicillins Rash     Review of Systems:  - Eyes: no blurry vision or double vision  - Cardiovascular: no chest pain  - Respiratory: no shortness of breath  - Musculoskeletal: no myalgias  - Neurological: no numbness/tingling in extremities    Physical Examination:  - Blood pressure 139/73, pulse 62, height '5\' 10"'  (1.778 m), weight 233 lb 9.6 oz (105.96 kg).  - General: pleasant, no distress, good eye contact   - Neck: no carotid bruits  - Cardiovascular: regular, normal rate, nl s1 and s2, no m/r/g,   - Respiratory: clear bilaterally  - Integumentary: no edema,    - Psychiatric: normal mood and affect    Data Reviewed:   Component      Latest Ref Rng 11/11/2014 11/11/2014 11/11/2014 11/11/2014           8:18 AM  8:18 AM  8:18 AM  8:18 AM   Glucose      65 - 99 mg/dL  181 (H)     BUN      8 - 27 mg/dL  18     Creatinine      0.76 - 1.27 mg/dL  0.91     GFR est non-AA      >59 mL/min/1.73  82     GFR est AA      >59 mL/min/1.73  95     BUN/Creatinine ratio      10 - 22  20     Sodium      134 - 144 mmol/L  140     Potassium      3.5 - 5.2 mmol/L  4.2     Chloride      97 - 108 mmol/L  101     CO2      18 - 29 mmol/L  22     Calcium  8.6 - 10.2 mg/dL  9.4     Protein, total      6.0 - 8.5 g/dL  6.5     Albumin      3.5 - 4.8 g/dL  4.3     GLOBULIN, TOTAL      1.5 - 4.5 g/dL  2.2     A-G Ratio      1.1 - 2.5  2.0     Bilirubin, total      0.0 - 1.2 mg/dL  0.6     Alk. phosphatase      39 - 117 IU/L  75     AST      0 - 40 IU/L  21     ALT      0 - 44 IU/L  42     Cholesterol, total      100 - 199 mg/dL 142      Triglyceride      0 - 149 mg/dL 195 (H)      HDL Cholesterol      >39 mg/dL 32 (L)      VLDL, calculated      5 - 40 mg/dL 39      LDL, calculated      0 - 99 mg/dL 71      Creatinine, urine      Not Estab. mg/dL   119.7    Microalbumin, urine      Not Estab. ug/mL   4.7    Microalbumin/Creat. Ratio      0.0 - 30.0 mg/g creat   3.9    Hemoglobin A1c, (calculated)      4.8 - 5.6 %    6.9 (H)       Assessment/Plan:     1. 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  A1c down with more metformin so will keep everything the same.  - cont metformin ER 500 mg 2 tabs at dinner and 1 tab in am   - check bs 4 times per week  - foot exam done 2/16  - optho UTD 9/15   - microalbumin nl 8/16  - check Hgb A1c and cmp prior to next visit     2. Hypertension (401.9AJ) his BP was at goal < 140/90  - cont hyzaar 50/12.5 mg daily     3. 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  LDL 79 in September 2011 and 86 in January 2012 and 83 in 9/12 and 85 in 7/13 and 84 in 12/13 and 88 in 6/14.  LDL 84 in 5/15 but TG up to 184 due to wt gain and higher A1c.  LDL 87 and TG 127 in 9/15 with wt loss and lower A1c.  LDL 81 and TG 181 in 2/16 with higher A1c.  LDL 71 in 8/16  - cont simva 20 mg daily  - check lipids prior to next visit       Patient Instructions   1) Your Hemoglobin A1c is a 3 month marker  of your diabetes control.  Goal is less than 7% which means your average blood sugar is less than 150. Your Hemoglobin A1c is 6.9% which means your diabetes is under better control than 7.1% at your last check.  Continue to work on your diet and exercise and take all your medications (metformin) as directed.    2) I sent a new supply of the metformin that has the correct dose on the bottle.    3) Your triglycerides (short term fats) are slightly higher and HDL (good cholesterol) is slightly lower due to small amount of weight gain that should improve if you lost 5-7 lbs over the next 6 months.    4) BUN and creatinine are markers of kidney function.  Your values are normal.    5) ALT and AST are markers of liver function.  Your values are normal.            Follow-up Disposition:  Return in about 6 months (around 05/17/2015).    Copy sent to:  [facility] [phone]

## 2014-11-14 NOTE — Patient Instructions (Signed)
1) Your Hemoglobin A1c is a 3 month marker of your diabetes control.  Goal is less than 7% which means your average blood sugar is less than 150. Your Hemoglobin A1c is 6.9% which means your diabetes is under better control than 7.1% at your last check.  Continue to work on your diet and exercise and take all your medications (metformin) as directed.    2) I sent a new supply of the metformin that has the correct dose on the bottle.    3) Your triglycerides (short term fats) are slightly higher and HDL (good cholesterol) is slightly lower due to small amount of weight gain that should improve if you lost 5-7 lbs over the next 6 months.    4) BUN and creatinine are markers of kidney function.  Your values are normal.    5) ALT and AST are markers of liver function.  Your values are normal.

## 2014-12-22 MED ORDER — METFORMIN SR 500 MG 24 HR TABLET
500 mg | ORAL_TABLET | ORAL | 3 refills | Status: DC
Start: 2014-12-22 — End: 2015-12-15

## 2015-01-02 MED ORDER — ONETOUCH ULTRASOFT LANCETS
3 refills | Status: DC
Start: 2015-01-02 — End: 2016-04-17

## 2015-01-27 MED ORDER — SIMVASTATIN 20 MG TAB
20 mg | ORAL_TABLET | ORAL | 3 refills | Status: DC
Start: 2015-01-27 — End: 2016-01-20

## 2015-03-16 NOTE — Telephone Encounter (Signed)
Added Cedric FishmanLinda Kernan as Proxy for Humana IncDonald Aramburo's Mychart.      Vicie Muttersarol P Lewis

## 2015-05-19 LAB — LIPID PANEL
Cholesterol, total: 139 mg/dL (ref 100–199)
HDL Cholesterol: 35 mg/dL — ABNORMAL LOW (ref >39)
LDL, calculated: 71 mg/dL (ref 0–99)
Triglyceride: 164 mg/dL — ABNORMAL HIGH (ref 0–149)
VLDL, calculated: 33 mg/dL (ref 5–40)

## 2015-05-19 LAB — METABOLIC PANEL, COMPREHENSIVE
A-G Ratio: 2.1 (ref 1.1–2.5)
ALT (SGPT): 35 IU/L (ref 0–44)
AST (SGOT): 22 IU/L (ref 0–40)
Albumin: 4.2 g/dL (ref 3.5–4.8)
Alk. phosphatase: 78 IU/L (ref 39–117)
BUN/Creatinine ratio: 16 (ref 10–22)
BUN: 14 mg/dL (ref 8–27)
Bilirubin, total: 0.5 mg/dL (ref 0.0–1.2)
CO2: 22 mmol/L (ref 18–29)
Calcium: 9 mg/dL (ref 8.6–10.2)
Chloride: 101 mmol/L (ref 96–106)
Creatinine: 0.86 mg/dL (ref 0.76–1.27)
GFR est AA: 97 mL/min/{1.73_m2} (ref >59)
GFR est non-AA: 84 mL/min/{1.73_m2} (ref >59)
GLOBULIN, TOTAL: 2 g/dL (ref 1.5–4.5)
Glucose: 175 mg/dL — ABNORMAL HIGH (ref 65–99)
Potassium: 4 mmol/L (ref 3.5–5.2)
Protein, total: 6.2 g/dL (ref 6.0–8.5)
Sodium: 140 mmol/L (ref 134–144)

## 2015-05-19 LAB — HEMOGLOBIN A1C WITH EAG
Estimated average glucose: 146 mg/dL
Hemoglobin A1c: 6.7 % — ABNORMAL HIGH (ref 4.8–5.6)

## 2015-05-19 LAB — DIABETES PATIENT EDUCATION

## 2015-05-19 LAB — CVD REPORT

## 2015-05-22 ENCOUNTER — Ambulatory Visit: Admit: 2015-05-22 | Discharge: 2015-05-22 | Payer: MEDICARE | Attending: Internal Medicine | Primary: Family Medicine

## 2015-05-22 DIAGNOSIS — E119 Type 2 diabetes mellitus without complications: Secondary | ICD-10-CM

## 2015-05-22 NOTE — Patient Instructions (Signed)
1) You can try B12 1000 mcg 1 tab daily to see if this helps with any pain in your feet.  If this persists, you can see a podiatrist.  You can Dr. Marzella Schlein at [phone] or Dr. Arnetha Massy at [phone] or Dr. Randalyn Rhea [phone] for podiatry.    2) Your Hemoglobin A1c (3 month test of blood sugar) is still very good at 6.7%.  Keep up the good work.    3) Your triglycerides (short term fats) are lower and your HDL (good cholesterol) is higher with 7 lb weight loss.    4) Your blood pressure is lower than last time.    5) I will mail you a lab slip about 3-4 weeks before your next visit to have your labs repeated 3-4 days prior to your next visit.

## 2015-05-22 NOTE — Progress Notes (Signed)
Chief Complaint   Patient presents with   ??? Diabetes     pcp and pharmacy confirmed     History of Present Illness: Walter Molina is a 75 y.o. male here for follow up of diabetes.  Weight down 7 lbs since last visit in 8/16.  Has been able to exercise once a week but is busy with work the other days.  Has cut back on portions.  Compliant with meds below.  Was off the simvastatin in December for 2 weeks when he was on abx for a prostate infection but has been back on since then.  Has had some pain on the top of his right foot that is intermittent and doesn't think he bumped it on anything.  Advised a trial of B12 to see if this helps.  Willing to be seen every 8 months at this point given how stable he is and he doesn't have a PCP.    Current Outpatient Prescriptions   Medication Sig   ??? simvastatin (ZOCOR) 20 mg tablet TAKE 1 TABLET BY MOUTH NIGHTLY   ??? ONETOUCH ULTRASOFT LANCETS misc TEST ONCE DAILY   ??? metFORMIN ER (GLUCOPHAGE XR) 500 mg tablet Take 1 tab with breakfast and 2 tabs with dinner   ??? losartan-hydrochlorothiazide (HYZAAR) 50-12.5 mg per tablet TAKE 1 TABLET BY MOUTH EVERYDAY   ??? glucose blood VI test strips (ONETOUCH ULTRA TEST) strip Test up to once daily.  Dx. E11.9   ??? glucose blood VI test strips (ONETOUCH ULTRA TEST) strip TEST UP TO ONCE DAILY. DX E11.9   ??? pantoprazole (PROTONIX) 40 mg tablet Take 40 mg by mouth daily.     No current facility-administered medications for this visit.      Allergies   Allergen Reactions   ??? Penicillins Rash     Review of Systems:  - Eyes: no blurry vision or double vision  - Cardiovascular: no chest pain  - Respiratory: no shortness of breath  - Musculoskeletal: no myalgias  - Neurological: no numbness/tingling in extremities    Physical Examination:  Blood pressure 133/69, pulse 60, height '5\' 10"'  (1.778 m), weight 226 lb 3.2 oz (102.6 kg).  - General: pleasant, no distress, good eye contact   - Neck: no carotid bruits   - Cardiovascular: regular, normal rate, nl s1 and s2, no m/r/g,   - Respiratory: clear bilaterally  - Integumentary: no edema,   - Psychiatric: normal mood and affect         Diabetic foot exam performed by Colbert Ewing, MD     Measurement  Response Nurse Comment Physician Comment   Monofilament  R - reduced sensation with micro filament  L - reduced sensation with micro filament     Pulse DP R - 2+ (normal)  L - 2+ (normal)     Vibration R - decreased  L - decreased     Structural deformity R - None  L - None     Skin Integrity / Deformity R - Mild - callus  L - Mild - callus        Reviewed by:               Data Reviewed:   Component      Latest Ref Rng & Units 05/18/2015 05/18/2015 05/18/2015           8:02 AM  8:02 AM  8:02 AM   Glucose      65 - 99 mg/dL  175 (H)   BUN      8 - 27 mg/dL   14   Creatinine      0.76 - 1.27 mg/dL   0.86   GFR est non-AA      >59 mL/min/1.73   84   GFR est AA      >59 mL/min/1.73   97   BUN/Creatinine ratio      10 - 22   16   Sodium      134 - 144 mmol/L   140   Potassium      3.5 - 5.2 mmol/L   4.0   Chloride      96 - 106 mmol/L   101   CO2      18 - 29 mmol/L   22   Calcium      8.6 - 10.2 mg/dL   9.0   Protein, total      6.0 - 8.5 g/dL   6.2   Albumin      3.5 - 4.8 g/dL   4.2   GLOBULIN, TOTAL      1.5 - 4.5 g/dL   2.0   A-G Ratio      1.1 - 2.5   2.1   Bilirubin, total      0.0 - 1.2 mg/dL   0.5   Alk. phosphatase      39 - 117 IU/L   78   AST      0 - 40 IU/L   22   ALT      0 - 44 IU/L   35   Cholesterol, total      100 - 199 mg/dL  139    Triglyceride      0 - 149 mg/dL  164 (H)    HDL Cholesterol      >39 mg/dL  35 (L)    VLDL, calculated      5 - 40 mg/dL  33    LDL, calculated      0 - 99 mg/dL  71    Hemoglobin A1c, (calculated)      4.8 - 5.6 % 6.7 (H)     Estimated average glucose      mg/dL 146         Assessment/Plan:     1. DM w/o complication type II (454.09) his most recent Hgb A1c was 6.7%  in 2/17 down from 6.9% in 8/16 down from 7.1% in 2/16 up from 6.6% in 9/15 down from 7.9% in 5/15 up from 6.7% in 11/14 down from 7% in 6/14 up from 6.7% in 12/13 down from 6.9% in July up from 6.4% in Jan 2013 up from 6.1% in September down from 6.4% in April up from 6.1% in January 2012 down from 7.1% in September 2011.  A1c at goal so will keep everything the same.  - cont metformin ER 500 mg 2 tabs at dinner and 1 tab in am   - check bs 4 times per week  - foot exam done 2/17  - optho UTD 9/16--will obtain report  - microalbumin nl 8/16  - check Hgb A1c and cmp and microalbumin prior to next visit     2. Hypertension (401.9AJ) his BP was at goal < 140/90  - cont hyzaar 50/12.5 mg daily     3. 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  LDL 79 in September 2011 and  86 in January 2012 and 83 in 9/12 and 85 in 7/13 and 84 in 12/13 and 88 in 6/14.  LDL 84 in 5/15 but TG up to 184 due to wt gain and higher A1c.  LDL 87 and TG 127 in 9/15 with wt loss and lower A1c.  LDL 81 and TG 181 in 2/16 with higher A1c.  LDL 71 in 8/16 and in 2/17  - cont simva 20 mg daily  - check lipids prior to next visit       Patient Instructions   1) You can try B12 1000 mcg 1 tab daily to see if this helps with any pain in your feet.  If this persists, you can see a podiatrist.  You can Dr. Verlene Mayer at [phone] or Dr. Geryl Councilman at [phone] or Dr. Larene Beach [phone] for podiatry.    2) Your Hemoglobin A1c (3 month test of blood sugar) is still very good at 6.7%.  Keep up the good work.    3) Your triglycerides (short term fats) are lower and your HDL (good cholesterol) is higher with 7 lb weight loss.    4) Your blood pressure is lower than last time.    5) I will mail you a lab slip about 3-4 weeks before your next visit to have your labs repeated 3-4 days prior to your next visit.       Follow-up Disposition:  Return in about 8 months (around 01/19/2016).     Copy sent to:  [facility] [phone]

## 2015-07-28 NOTE — Telephone Encounter (Signed)
From: Bonney Rousselonald L Totten  To: Carylon Perchesouglas A Johnson, MD  Sent: 07/28/2015 3:59 PM EDT  Subject:  Update Medical Information    Hi,    I  wanted to update Don's prescription file.     Raynaldo Opitzon  Visited [facility] a couple of weeks ago. 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  All  other test came back normal. He is to go back to dr in 6 months.    thanks

## 2015-08-13 MED ORDER — BLOOD SUGAR DIAGNOSTIC TEST STRIPS
ORAL_STRIP | 11 refills | Status: DC
Start: 2015-08-13 — End: 2016-07-28

## 2015-08-13 NOTE — Telephone Encounter (Signed)
From: Bonney Rousselonald L Dominey  To: Carylon Perchesouglas A Johnson, MD  Sent: 08/13/2015 8:50 AM EDT  Subject:  Update Medical Information    Hi,  Roe CoombsDon has stopped taking Tamsulosin. Symptoms were dizziness, nausua, and raising blood sugar level. No replacement at this time. Possibly going to try natural supplement. Keep you posted. Thanks

## 2015-10-06 NOTE — Telephone Encounter (Signed)
From: Laurence Aly  To: Colbert Ewing, MD  Sent: 10/06/2015 10:40 AM EDT  Subject:  Non-Urgent Medical Question    Good  Morning,    I  contacted the supplement department where I get almost all of our supplements that is owned by my former [facility] doctor.    They  have the berberine and this was their suggestion:  Do  not stop the metformin "cold [place]". Start by taking one of the berberine in the am, 2 metformin @ night. Then 1 bergerine, am, 1 berberine pm, 1 metformin pm, until you are taking all berberine.    They did not feel it was a problem mixing the two until off the Met.  Also,  will you continue to refill prescriptions for the needles/strips so we can monitor closely? I will keep you posted until his October visit. She also suggested he take Alpha -Lipoic acid along with berberine.  Do  you agree with regimen?  Thanks!

## 2015-10-18 MED ORDER — ONETOUCH ULTRA TEST STRIPS
ORAL_STRIP | 11 refills | Status: AC
Start: 2015-10-18 — End: ?

## 2015-10-22 MED ORDER — LOSARTAN-HYDROCHLOROTHIAZIDE 50 MG-12.5 MG TAB
ORAL_TABLET | ORAL | 3 refills | Status: DC
Start: 2015-10-22 — End: 2016-10-11

## 2015-11-23 NOTE — Telephone Encounter (Signed)
From: Walter Molina  To: Carylon Perchesouglas A Johnson, MD  Sent: 11/23/2015 8:36 AM EDT  Subject:  Non-Urgent Medical Question    Good  Morning,  Walter Molina's  sugar reading is staying around 190, 197, 200 for early morning readings. He checked yesterday after lunch it was 200. I looked in the log book that we keep and it appears to start escalating in June. I know he had infection and was on an antibiotic, and I am sure that effected at that time. He is still doing 1 berbe  rine  in the am, one berberine, one metformin in the evening.  Do  we possibly need to do blood work before his visit in Oct so you can see what the average is?  Please  advise.  thanks  in advance for your time.

## 2015-11-27 ENCOUNTER — Encounter

## 2015-12-03 NOTE — Telephone Encounter (Signed)
lmtrc x1

## 2015-12-03 NOTE — Telephone Encounter (Signed)
They replied back that they will have labs done at [facility].     There are multiple medication options to help improve glucoses, but Dr Laural BenesJohnson may prefer different medications than I would based on his preference and based on Mr Jaster's history.    Can they make it until early next week?  I'll respond to [facility] [facility] message as well

## 2015-12-03 NOTE — Telephone Encounter (Signed)
Patients wife is calling in regards to patient. She stated that the patients blood sugars are out of control and also that she would like to know if the patient should come in to have lab work done for Dr. Laural Benes to review when he comes back from vacation. Patients wife's name is Bonita Quin and she is on the HIPAA. She can be reached at [phone].

## 2015-12-03 NOTE — Telephone Encounter (Signed)
Dr Adriana Simas you responded to the Mychart message regarding elevated sugar on 8/14. Do you want patient to have labs?

## 2015-12-05 LAB — METABOLIC PANEL, COMPREHENSIVE
A-G Ratio: 2 (ref 1.2–2.2)
ALT (SGPT): 29 IU/L (ref 0–44)
AST (SGOT): 23 IU/L (ref 0–40)
Albumin: 4.2 g/dL (ref 3.5–4.8)
Alk. phosphatase: 72 IU/L (ref 39–117)
BUN/Creatinine ratio: 19 (ref 10–24)
BUN: 18 mg/dL (ref 8–27)
Bilirubin, total: 0.6 mg/dL (ref 0.0–1.2)
CO2: 23 mmol/L (ref 18–29)
Calcium: 8.9 mg/dL (ref 8.6–10.2)
Chloride: 101 mmol/L (ref 96–106)
Creatinine: 0.96 mg/dL (ref 0.76–1.27)
GFR est AA: 88 mL/min/{1.73_m2} (ref >59)
GFR est non-AA: 76 mL/min/{1.73_m2} (ref >59)
GLOBULIN, TOTAL: 2.1 g/dL (ref 1.5–4.5)
Glucose: 192 mg/dL — ABNORMAL HIGH (ref 65–99)
Potassium: 3.8 mmol/L (ref 3.5–5.2)
Protein, total: 6.3 g/dL (ref 6.0–8.5)
Sodium: 141 mmol/L (ref 134–144)

## 2015-12-05 LAB — LIPID PANEL
Cholesterol, total: 119 mg/dL (ref 100–199)
HDL Cholesterol: 34 mg/dL — ABNORMAL LOW (ref >39)
LDL, calculated: 64 mg/dL (ref 0–99)
Triglyceride: 104 mg/dL (ref 0–149)
VLDL, calculated: 21 mg/dL (ref 5–40)

## 2015-12-05 LAB — MICROALBUMIN, UR, RAND W/ MICROALB/CREAT RATIO
Creatinine, urine random: 123 mg/dL
Microalb/Creat ratio (ug/mg creat.): 4 mg/g creat (ref 0.0–30.0)
Microalbumin, urine: 4.9 ug/mL

## 2015-12-05 LAB — HEMOGLOBIN A1C WITH EAG
Estimated average glucose: 163 mg/dL
Hemoglobin A1c: 7.3 % — ABNORMAL HIGH (ref 4.8–5.6)

## 2015-12-15 MED ORDER — METFORMIN SR 500 MG 24 HR TABLET
500 mg | ORAL_TABLET | ORAL | 3 refills | Status: DC
Start: 2015-12-15 — End: 2015-12-15

## 2015-12-15 MED ORDER — METFORMIN SR 500 MG 24 HR TABLET
500 mg | ORAL_TABLET | ORAL | 3 refills | Status: DC
Start: 2015-12-15 — End: 2016-11-21

## 2015-12-15 NOTE — Telephone Encounter (Signed)
From: Bonney Rousselonald L Game  To: Carylon Perchesouglas A Johnson, MD  Sent: 12/15/2015 11:00 AM EDT  Subject:  Non-Urgent Medical Question    Good  Morning, I hope you had a great Labor Day weekend.    Roe CoombsDon  has dropped taking berberine, per our last conversation and has been taking Metformin, 1 in the am, 2 in the pm for a week. Sugar level is still running relatively hi. The lowest reading in past week has been 178 early am. It runs, 187, 197, sometimes in the 200's.  Do  you feel after switching back to all metformin it has not time to regulate? Sugar level was running higher than norm when we ask if we could try something new.  Please  advise.  thanks!

## 2015-12-22 NOTE — Telephone Encounter (Signed)
From: Laurence Aly  To: Colbert Ewing, MD  Sent: 12/22/2015 9:48 AM EDT  Subject:  Non-Urgent Medical Question    Good  Morning,  Timmothy Sours  has been taking 2 Met in the am, 2 in the PM. Sugar reading is slowly going down. It has been in the 180's, 170's and 160's a few times. Sugar reading was 130's a couple of times on the Berberine, but then it started inclining. Don's apt is in Oct. Do you want him to continue with 4 per day?  Thanks

## 2016-01-19 NOTE — Telephone Encounter (Signed)
From: Bonney Rousselonald L Waterbury  To: Carylon Perchesouglas A Johnson, MD  Sent: 01/19/2016 8:31 AM EDT  Subject:  Prescription Question    Good  Morning,    Roe CoombsDon  was going to do blood work this am, but realized he did not have the lab paperwork. [facility] so he can go in the am? Don's apt is Fri., 10/13.    thanks

## 2016-01-20 MED ORDER — SIMVASTATIN 20 MG TAB
20 mg | ORAL_TABLET | ORAL | 3 refills | Status: DC
Start: 2016-01-20 — End: 2017-01-10

## 2016-01-22 ENCOUNTER — Ambulatory Visit: Admit: 2016-01-22 | Discharge: 2016-01-22 | Payer: MEDICARE | Attending: Internal Medicine | Primary: Family Medicine

## 2016-01-22 DIAGNOSIS — E119 Type 2 diabetes mellitus without complications: Secondary | ICD-10-CM

## 2016-01-22 NOTE — Patient Instructions (Signed)
1) Go have your A1c drawn the first week of December and I'll e-mail you the results.    2) Then go the week before your next visit in April.    3) Do your best to focus on the meals that are causing you to spike over 180 when checked 2 hours after a meal and limit your portions of these foods.    4) Your blood pressure and cholesterol and liver and kidney and urine tests all look good.

## 2016-01-22 NOTE — Progress Notes (Addendum)
Chief Complaint   Patient presents with   ??? Diabetes     PCP ([facility]) and Pharmacy Confirmed     History of Present Illness: Walter Molina is a 75 y.o. male here for follow up of diabetes.  Weight down 4 lbs since last visit in 2/17.  Over the summer he tried to switch to berberine but his sugars started going higher so I advised him to switch back to Metformin and increase his dose to 2 tabs bid.  I also advised him to start checking some 2 hour after meal readings and has been doing this.  Is identifying the foods that are causing him to spike over 180 and is trying to limit these foods.  Tends to spike in the 180-205 range 2-3 times a week when he eats out.  Compliant with BP and lipid regimen.     Current Outpatient Prescriptions   Medication Sig   ??? indomethacin (INDOCIN) 50 mg capsule TK 1 C PO TID WF OR MILK PRF PAIN   ??? simvastatin (ZOCOR) 20 mg tablet TAKE 1 TABLET BY MOUTH NIGHTLY   ??? metFORMIN ER (GLUCOPHAGE XR) 500 mg tablet Take 2 tabs with breakfast and 2 tabs with dinner   ??? losartan-hydroCHLOROthiazide (HYZAAR) 50-12.5 mg per tablet TAKE 1 TABLET BY MOUTH EVERYDAY   ??? ONETOUCH ULTRA TEST strip TEST UP TO ONCE DAILY. DX E11.9   ??? glucose blood VI test strips (ONETOUCH ULTRA TEST) strip Test up to once daily.  Dx. E11.9   ??? cyanocobalamin (VITAMIN B-12) 1,000 mcg tablet Take 1,000 mcg by mouth daily.   ??? ONETOUCH ULTRASOFT LANCETS misc TEST ONCE DAILY   ??? pantoprazole (PROTONIX) 40 mg tablet Take 40 mg by mouth daily.     No current facility-administered medications for this visit.      Allergies   Allergen Reactions   ??? Penicillins Rash   ??? Tamsulosin Other (comments)     dizziness, nausea, and raising blood sugar level     Review of Systems:  - Eyes: no blurry vision or double vision  - Cardiovascular: no chest pain  - Respiratory: no shortness of breath  - Musculoskeletal: no myalgias  - Neurological: no numbness/tingling in extremities    Physical Examination:   Blood pressure 137/52, pulse (!) 52, resp. rate 16, height '5\' 10"'  (1.778 m), weight 222 lb (100.7 kg), SpO2 96 %.  - General: pleasant, no distress, good eye contact   - Neck: no carotid bruits  - Cardiovascular: regular, normal rate, nl s1 and s2, no m/r/g,   - Respiratory: clear bilaterally  - Integumentary: no edema,   - Psychiatric: normal mood and affect    Data Reviewed:   Component      Latest Ref Rng & Units 12/04/2015 12/04/2015 12/04/2015 12/04/2015           7:55 AM  7:55 AM  7:55 AM  7:55 AM   Glucose      65 - 99 mg/dL   192 (H)    BUN      8 - 27 mg/dL   18    Creatinine      0.76 - 1.27 mg/dL   0.96    GFR est non-AA      >59 mL/min/1.73   76    GFR est AA      >59 mL/min/1.73   88    BUN/Creatinine ratio      10 - 24   19  Sodium      134 - 144 mmol/L   141    Potassium      3.5 - 5.2 mmol/L   3.8    Chloride      96 - 106 mmol/L   101    CO2      18 - 29 mmol/L   23    Calcium      8.6 - 10.2 mg/dL   8.9    Protein, total      6.0 - 8.5 g/dL   6.3    Albumin      3.5 - 4.8 g/dL   4.2    GLOBULIN, TOTAL      1.5 - 4.5 g/dL   2.1    A-G Ratio      1.2 - 2.2   2.0    Bilirubin, total      0.0 - 1.2 mg/dL   0.6    Alk. phosphatase      39 - 117 IU/L   72    AST      0 - 40 IU/L   23    ALT (SGPT)      0 - 44 IU/L   29    Cholesterol, total      100 - 199 mg/dL    119   Triglyceride      0 - 149 mg/dL    104   HDL Cholesterol      >39 mg/dL    34 (L)   VLDL, calculated      5 - 40 mg/dL    21   LDL, calculated      0 - 99 mg/dL    64   Creatinine, urine      Not Estab. mg/dL  123.0     Microalbumin, urine      Not Estab. ug/mL  4.9     Microalbumin/Creat. Ratio      0.0 - 30.0 mg/g creat  4.0     Hemoglobin A1c, (calculated)      4.8 - 5.6 % 7.3 (H)      Estimated average glucose      mg/dL 163          Assessment/Plan:     1. DM w/o complication type II (937.16) his most recent Hgb A1c was 7.3% in 8/17 up from 6.7% in 2/17 down from 6.9% in 8/16 down from 7.1% in 2/16  up from 6.6% in 9/15 down from 7.9% in 5/15 up from 6.7% in 11/14 down from 7% in 6/14 up from 6.7% in 12/13 down from 6.9% in July up from 6.4% in Jan 2013 up from 6.1% in September down from 6.4% in April up from 6.1% in January 2012 down from 7.1% in September 2011.  A1c above goal in 8/17 due to being off diet and changing to berberine.  Now things are coming back down.  Will have him continue to focus on post-meal readings and repeat his A1c in another month.  - cont metformin ER 500 mg 2 tabs at dinner and 2 tabs in am   - check bs daily focusing on after meal readings  - foot exam done 2/17  - optho UTD 6/16  - microalbumin nl 8/17  - check Hgb A1c and cmp prior to next visit  - check A1c in 12/17     2. Hypertension (401.9AJ) his BP was at goal < 140/90  - cont hyzaar 50/12.5 mg daily  3. 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  LDL 79 in September 2011 and 86 in January 2012 and 83 in 9/12 and 85 in 7/13 and 84 in 12/13 and 88 in 6/14.  LDL 84 in 5/15 but TG up to 184 due to wt gain and higher A1c.  LDL 87 and TG 127 in 9/15 with wt loss and lower A1c.  LDL 81 and TG 181 in 2/16 with higher A1c.  LDL 71 in 8/16 and in 2/17.  Down to 84 in 8/17  - cont simva 20 mg daily  - check lipids prior to next visit       Patient Instructions   1) Go have your A1c drawn the first week of December and I'll e-mail you the results.    2) Then go the week before your next visit in April.    3) Do your best to focus on the meals that are causing you to spike over 180 when checked 2 hours after a meal and limit your portions of these foods.    4) Your blood pressure and cholesterol and liver and kidney and urine tests all look good.        Follow-up Disposition:  Return in about 6 months (around 07/22/2016).    Copy sent to:  [facility] [phone]    Lab follow up: 03/19/16  Component      Latest Ref Rng & Units 03/18/2016          10:01 AM    Hemoglobin A1c, (calculated)      4.8 - 5.6 % 6.4 (H)   Estimated average glucose      mg/dL 137     Sent him the following message through MyChart:  Hemoglobin A1c is a 3 month marker of your diabetes control.  Goal is less than 7% which means your average blood sugar is less than 150. Your Hemoglobin A1c is 6.4% which means your diabetes is under better control than 7.3% at your last check.  Continue to work on your diet and exercise and take all your medications as directed.

## 2016-01-22 NOTE — Progress Notes (Signed)
Walter RousselDonald L Molina is a 75 y.o. male        Chief Complaint   Patient presents with   ??? Diabetes     PCP ([facility]) and Pharmacy Confirmed         1. Have you been to the ER, urgent care clinic since your last visit?  Hospitalized since your last visit?No    2. Have you seen or consulted any other health care providers outside of the [facility] since your last visit?  Include any pap smears or colon screening. No

## 2016-03-19 LAB — HEMOGLOBIN A1C WITH EAG
Estimated average glucose: 137 mg/dL
Hemoglobin A1c: 6.4 % — ABNORMAL HIGH (ref 4.8–5.6)

## 2016-04-18 MED ORDER — ONETOUCH ULTRASOFT LANCETS
3 refills | Status: DC
Start: 2016-04-18 — End: 2016-04-21

## 2016-04-21 MED ORDER — ONETOUCH ULTRASOFT LANCETS
3 refills | Status: DC
Start: 2016-04-21 — End: 2017-10-31

## 2016-04-21 NOTE — Addendum Note (Signed)
Addended by: Fredirick MaudlinJOHNSON, Parrie Rasco A on: 04/21/2016 05:27 PM      Modules accepted: Orders

## 2016-07-01 ENCOUNTER — Ambulatory Visit
Admit: 2016-07-01 | Discharge: 2016-07-01 | Payer: MEDICARE | Attending: Cardiovascular Disease | Primary: Family Medicine

## 2016-07-01 DIAGNOSIS — R079 Chest pain, unspecified: Secondary | ICD-10-CM

## 2016-07-01 NOTE — Progress Notes (Signed)
Chief Complaint   Patient presents with   ??? Foot Swelling     RIGHT FOOT   ??? Chest Pain (Angina)     1. Have you been to the ER, urgent care clinic since your last visit?  Hospitalized since your last visit? NO    2. Have you seen or consulted any other health care providers outside of the [facility] since your last visit?  Include any pap smears or colon screening. NO

## 2016-07-01 NOTE — Progress Notes (Signed)
Walter Reddish DNP, ANP-BC  Subjective/HPI:     Walter Molina is a 76 y.o. male is here for routine f/u.  Mr. Cozzens reports he has had a couple episodes of left anterior chest discomfort that he feels at times could be associated with a cold or congestion.  He denies dyspnea on exertion or diaphoresis/nausea vomiting.  Patient reports he does admit to increasing generalized weakness and fatigue.       PCP Provider  Phys Other, MD  Past Medical History:   Diagnosis Date   ??? Arthritis     knees   ??? GERD (gastroesophageal reflux disease)    ??? Hypertension    ??? Ill-defined condition     high cholesterol   ??? Other and unspecified hyperlipidemia    ??? Other ill-defined conditions(799.89)     diverticular disease/colon   ??? Seasonal allergic rhinitis    ??? Type II or unspecified type diabetes mellitus without mention of complication, not stated as uncontrolled     borderline      Past Surgical History:   Procedure Laterality Date   ??? ABDOMEN SURGERY PROC UNLISTED      colectomy/12 years ago   ??? COLONOSCOPY  10/10/2008        ??? HX APPENDECTOMY     ??? HX CATARACT REMOVAL Bilateral 9/13   ??? HX ORTHOPAEDIC      bunionectomy b/l   ??? HX ORTHOPAEDIC  07/24/13    right 2nd toe had bone spur removed   ??? HX ORTHOPAEDIC  02/18/14    LEFT TOTAL KNEE ARTHROPLASTY WITH NAVIGATION   ??? HX TONSILLECTOMY       Allergies   Allergen Reactions   ??? Penicillins Rash   ??? Tamsulosin Other (comments)     dizziness, nausea, and raising blood sugar level      Family History   Problem Relation Age of Onset   ??? Diabetes Father    ??? Stroke Father 2   ??? Hypertension Mother    ??? Diabetes Other      cousin   ??? Heart Disease Neg Hx       Current Outpatient Prescriptions   Medication Sig   ??? SAW PALMETTO XTR/ZINC PICOLIN (SAW PALMETTO EXTRACT PO) Take  by mouth daily.   ??? ONETOUCH ULTRASOFT LANCETS misc TEST ONCE DAILY. DX E11.9   ??? simvastatin (ZOCOR) 20 mg tablet TAKE 1 TABLET BY MOUTH NIGHTLY    ??? metFORMIN ER (GLUCOPHAGE XR) 500 mg tablet Take 2 tabs with breakfast and 2 tabs with dinner   ??? ONETOUCH ULTRA TEST strip TEST UP TO ONCE DAILY. DX E11.9   ??? glucose blood VI test strips (ONETOUCH ULTRA TEST) strip Test up to once daily.  Dx. E11.9   ??? pantoprazole (PROTONIX) 40 mg tablet Take 40 mg by mouth daily.   ??? indomethacin (INDOCIN) 50 mg capsule TK 1 C PO TID WF OR MILK PRF PAIN   ??? losartan-hydroCHLOROthiazide (HYZAAR) 50-12.5 mg per tablet TAKE 1 TABLET BY MOUTH EVERYDAY   ??? cyanocobalamin (VITAMIN B-12) 1,000 mcg tablet Take 1,000 mcg by mouth daily.     No current facility-administered medications for this visit.       Vitals:    07/01/16 0856 07/01/16 0914   BP: 130/80 128/70   Pulse: (!) 57    SpO2: 98%    Weight: 225 lb 8 oz (102.3 kg)    Height: '5\' 10"'  (1.778 m)      Social History  Social History   ??? Marital status: MARRIED     Spouse name: N/A   ??? Number of children: N/A   ??? Years of education: N/A     Occupational History   ??? Not on file.     Social History Main Topics   ??? Smoking status: Never Smoker   ??? Smokeless tobacco: Never Used   ??? Alcohol use Yes      Comment: very rarely may have a beer or glass of wine   ??? Drug use: No   ??? Sexual activity: Not on file     Other Topics Concern   ??? Not on file     Social History Narrative    Lives in [place] with wife of 9 years.  Has 2 sons from a previous marriage.    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    Likes to work in the yard.         I have reviewed the nurses notes, vitals, problem list, allergy list, medical history, family, social history and medications.    Review of Symptoms:    General: Pt denies excessive weight gain or loss. Pt is able to conduct ADL's  HEENT: Denies blurred vision, headaches, epistaxis and difficulty swallowing.  Respiratory: Denies shortness of breath, DOE, wheezing or stridor.  Cardiovascular: + Chest discomfort, no palpitations, +right foot swelling  with prolonged standing denies leg pain   Gastrointestinal: Denies poor appetite, indigestion, abdominal pain or blood in stool  Musculoskeletal: Denies pain or swelling from muscles or joints  Neurologic: Denies tremor, paresthesias, or sensory motor disturbance  Skin: Denies rash, itching or texture change.      Physical Exam: ??    General: Well developed, in no acute distress, cooperative and alert  HEENT: No carotid bruits, no JVD, trach is midline. Neck Supple, PEERL, EOM intact.  Heart: ??Normal S1/S2 negative S3 or S4. Regular, no murmur, gallop or rub.??  Respiratory: Clear bilaterally x 4, no wheezing or rales  Abdomen:?? ??Soft, non-tender, no masses, bowel sounds are active.??  Extremities:  No edema, normal cap refill, no cyanosis, atraumatic.   Neuro: A&Ox3, speech clear, gait stable.   Skin: Skin color is normal. No rashes or lesions. Non diaphoretic  Vascular: 2+ pulses symmetric in all extremities    Cardiographics    ECG: Normal sinus rhythm  Results for orders placed or performed during the hospital encounter of 11/10/13   EKG, 12 LEAD, INITIAL   Result Value Ref Range    Ventricular Rate 46 BPM    Atrial Rate 46 BPM    P-R Interval 176 ms    QRS Duration 110 ms    Q-T Interval 478 ms    QTC Calculation (Bezet) 418 ms    Calculated P Axis 31 degrees    Calculated R Axis -14 degrees    Calculated T Axis 31 degrees    Diagnosis       Sinus bradycardia  No previous ECGs available  Confirmed by Tyson Babinski [phone]) on 11/11/2013 8:21:28 AM           Cardiology Labs:  Lab Results   Component Value Date/Time    Cholesterol, total 119 12/04/2015 07:55 AM    HDL Cholesterol 34 (L) 12/04/2015 07:55 AM    LDL, calculated 64 12/04/2015 07:55 AM    Triglyceride 104 12/04/2015 07:55 AM       Lab Results   Component Value Date/Time  Sodium 141 12/04/2015 07:55 AM    Potassium 3.8 12/04/2015 07:55 AM    Chloride 101 12/04/2015 07:55 AM    CO2 23 12/04/2015 07:55 AM    Anion gap 9 02/19/2014 04:33 AM     Glucose 192 (H) 12/04/2015 07:55 AM    BUN 18 12/04/2015 07:55 AM    Creatinine 0.96 12/04/2015 07:55 AM    BUN/Creatinine ratio 19 12/04/2015 07:55 AM    GFR est AA 88 12/04/2015 07:55 AM    GFR est non-AA 76 12/04/2015 07:55 AM    Calcium 8.9 12/04/2015 07:55 AM    Bilirubin, total 0.6 12/04/2015 07:55 AM    AST (SGOT) 23 12/04/2015 07:55 AM    Alk. phosphatase 72 12/04/2015 07:55 AM    Protein, total 6.3 12/04/2015 07:55 AM    Albumin 4.2 12/04/2015 07:55 AM    Globulin 3.1 02/03/2014 10:08 AM    A-G Ratio 2.0 12/04/2015 07:55 AM    ALT (SGPT) 29 12/04/2015 07:55 AM           Assessment:     Assessment:     Diagnoses and all orders for this visit:    1. Chest pain with moderate risk for cardiac etiology    2. Essential hypertension, benign  -     AMB POC EKG ROUTINE W/ 12 LEADS, INTER & REP    3. Palpitations  -     AMB POC EKG ROUTINE W/ 12 LEADS, INTER & REP    4. Hyperlipidemia with target LDL less than 100    5. Controlled type 2 diabetes mellitus without complication, without long-term current use of insulin ([place])        ICD-10-CM ICD-9-CM    1. Chest pain with moderate risk for cardiac etiology R07.9 786.50    2. Essential hypertension, benign I10 401.1 AMB POC EKG ROUTINE W/ 12 LEADS, INTER & REP   3. Palpitations R00.2 785.1 AMB POC EKG ROUTINE W/ 12 LEADS, INTER & REP   4. Hyperlipidemia with target LDL less than 100 E78.5 272.4    5. Controlled type 2 diabetes mellitus without complication, without long-term current use of insulin ([place]) E11.9 250.00      Orders Placed This Encounter   ??? AMB POC EKG ROUTINE W/ 12 LEADS, INTER & REP     Order Specific Question:   Reason for Exam:     Answer:   ROUTINE   ??? SAW PALMETTO XTR/ZINC PICOLIN (SAW PALMETTO EXTRACT PO)     Sig: Take  by mouth daily.        Plan:     Patientis a 76 year old male presenting with episodes of left anterior chest discomfort; cardiac risk factors include male, hypertension,  elevated BMI, hyperlipidemia.  Will evaluate with stress Myoview, start over-the-counter enteric-coated aspirin 81 mg.  Hypertension: Controlled 128/70  Type 2 diabetes: Followed by endocrinology  Hyperlipidemia: On statin therapy LDL 64 on 11/2015.  Follow-up when testing complete  Walter Reddish, NP    This note was created using voice recognition software. Despite editing, there may be syntax errors.       [facility]    07/01/2016         Patient seen, examined by me personally. Plan discussed as detailed. Agree with note as outlined by  NP. I confirm findings in history and physical exam. No additional findings noted. Agree with plan as outlined above.     Pindipapanahall Ramond Dial, MD

## 2016-07-19 ENCOUNTER — Encounter: Primary: Family Medicine

## 2016-07-22 LAB — HEMOGLOBIN A1C WITH EAG
Estimated average glucose: 146 mg/dL
Hemoglobin A1c: 6.7 % — ABNORMAL HIGH (ref 4.8–5.6)

## 2016-07-22 LAB — METABOLIC PANEL, COMPREHENSIVE
A-G Ratio: 1.8 (ref 1.2–2.2)
ALT (SGPT): 41 IU/L (ref 0–44)
AST (SGOT): 27 IU/L (ref 0–40)
Albumin: 4.1 g/dL (ref 3.5–4.8)
Alk. phosphatase: 73 IU/L (ref 39–117)
BUN/Creatinine ratio: 16 (ref 10–24)
BUN: 20 mg/dL (ref 8–27)
Bilirubin, total: 0.4 mg/dL (ref 0.0–1.2)
CO2: 24 mmol/L (ref 18–29)
Calcium: 9.5 mg/dL (ref 8.6–10.2)
Chloride: 99 mmol/L (ref 96–106)
Creatinine: 1.23 mg/dL (ref 0.76–1.27)
GFR est AA: 65 mL/min/{1.73_m2} (ref >59)
GFR est non-AA: 56 mL/min/{1.73_m2} — ABNORMAL LOW (ref >59)
GLOBULIN, TOTAL: 2.3 g/dL (ref 1.5–4.5)
Glucose: 225 mg/dL — ABNORMAL HIGH (ref 65–99)
Potassium: 5 mmol/L (ref 3.5–5.2)
Protein, total: 6.4 g/dL (ref 6.0–8.5)
Sodium: 140 mmol/L (ref 134–144)

## 2016-07-22 LAB — LIPID PANEL
Cholesterol, total: 129 mg/dL (ref 100–199)
HDL Cholesterol: 22 mg/dL — ABNORMAL LOW (ref >39)
LDL, calculated: 83 mg/dL (ref 0–99)
Triglyceride: 122 mg/dL (ref 0–149)
VLDL, calculated: 24 mg/dL (ref 5–40)

## 2016-07-22 LAB — DIABETES PATIENT EDUCATION

## 2016-07-22 LAB — CKD REPORT

## 2016-07-22 LAB — CVD REPORT

## 2016-07-23 ENCOUNTER — Inpatient Hospital Stay
Admit: 2016-07-23 | Discharge: 2016-07-23 | Disposition: A | Discharge status: Home / Self Care | Payer: MEDICARE | Attending: Emergency Medicine

## 2016-07-23 ENCOUNTER — Emergency Department: Admit: 2016-07-23 | Payer: MEDICARE | Primary: Family Medicine

## 2016-07-23 DIAGNOSIS — T59891A Toxic effect of other specified gases, fumes and vapors, accidental (unintentional), initial encounter: Secondary | ICD-10-CM

## 2016-07-23 NOTE — ED Notes (Signed)
PA Evan Jones reviewed discharge instructions with the patient.  The patient verbalized understanding.  All questions and concerns were addressed.  The patient declined a wheelchair and is discharged ambulatory in the care of family members with instructions and prescriptions in hand.  Pt is alert and oriented x 4.  Respirations are clear and unlabored.

## 2016-07-23 NOTE — ED Notes (Addendum)
Called Poison Control center: advise is supportive care as pt is asymptomatic; otherwise educate pt to Not expose hisself to any open flames ie grill, anyone smoking for 24 hours per  [place] at poison control center "you are flammable"  Education provided to pt.   Pt denies any GI symptoms or respiratory symptoms at this time  Pt did drink 6-8 ounces of water since occurence
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
## 2016-07-23 NOTE — ED Notes (Signed)
Patient has no complaints and just needed to be seen by a physician due to the nature of the situation.

## 2016-07-28 ENCOUNTER — Ambulatory Visit: Admit: 2016-07-28 | Discharge: 2016-07-28 | Payer: MEDICARE | Attending: Internal Medicine | Primary: Family Medicine

## 2016-07-28 DIAGNOSIS — E119 Type 2 diabetes mellitus without complications: Secondary | ICD-10-CM

## 2016-07-28 NOTE — Patient Instructions (Signed)
1) Your Hemoglobin A1c is a 3 month marker of your diabetes control.  Goal is less than 7% which means your average blood sugar is less than 150. Your Hemoglobin A1c is 6.7% which means your diabetes is under slightly worse control than 6.4% at your last check but is still at goal.  Continue to work on your diet and exercise and take all your medications as directed.    2) Your weight is down 5 lbs since last visit.    3) Your blood pressure and cholesterol are at goal.    4) Your ALT and AST are markers of liver function.  Your values are normal.  BUN and creatinine are markers of kidney function.  Your values are normal.

## 2016-07-28 NOTE — Addendum Note (Signed)
Addended by: Fredirick Maudlin A on: 01/28/2017 09:56 AM     Modules accepted: Orders
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
## 2016-08-01 ENCOUNTER — Institutional Professional Consult (permissible substitution): Admit: 2016-08-01 | Discharge: 2016-08-01 | Payer: MEDICARE | Primary: Family Medicine

## 2016-08-01 ENCOUNTER — Institutional Professional Consult (permissible substitution)

## 2016-08-01 DIAGNOSIS — R931 Abnormal findings on diagnostic imaging of heart and coronary circulation: Secondary | ICD-10-CM

## 2016-08-01 MED ORDER — KIT FOR THE PREPARATION OF TC-99M-TETROFOSMIN 0.23 MG IV SOLUTION
0.23 mg | Freq: Once | INTRAVENOUS | 0 refills | Status: AC
Start: 2016-08-01 — End: 2016-08-01

## 2016-08-01 NOTE — Progress Notes (Signed)
The patient was identified by name and date of birth. The risks and side effects were explained to the patient and questions were answered prior to testing. Myoview stress test was completed.

## 2016-08-01 NOTE — Progress Notes (Signed)
Stress test abnormal. Schedule echo and then f/u to discuss. thx.

## 2016-08-01 NOTE — Progress Notes (Signed)
Left message on home number for patient to return my call.

## 2016-08-02 NOTE — Progress Notes (Signed)
Left message on home phone for patient to return my call.

## 2016-08-03 ENCOUNTER — Encounter

## 2016-08-03 NOTE — Progress Notes (Signed)
Spoke with patient regarding STRESS test results.  Verified patient with two identifiers.  Pt will need an ECHO scheduled then a follow up with Dr. Daleen Bo.  Clydie Braun when you call to set up, use cell number.  Thanks

## 2016-08-12 ENCOUNTER — Institutional Professional Consult (permissible substitution): Admit: 2016-08-12 | Discharge: 2016-08-12 | Payer: MEDICARE | Primary: Family Medicine

## 2016-08-12 ENCOUNTER — Encounter: Primary: Family Medicine

## 2016-08-12 DIAGNOSIS — I10 Essential (primary) hypertension: Secondary | ICD-10-CM

## 2016-08-12 LAB — ECHOCARDIOGRAM COMPLETE 2D W DOPPLER W COLOR: Left Ventricular Ejection Fraction: 63

## 2016-08-12 NOTE — Progress Notes (Signed)
The patient was identified by name and date of birth. The test was explained to patient and questions were answered prior to testing.

## 2016-08-15 NOTE — Progress Notes (Signed)
Echo normal, thx.

## 2016-08-16 NOTE — Progress Notes (Signed)
Verified patient with two identifiers.  Spoke with patient regarding ECHO test results.

## 2016-09-20 MED ORDER — ONETOUCH ULTRA BLUE TEST STRIP
ORAL_STRIP | 3 refills | Status: DC
Start: 2016-09-20 — End: 2017-09-22

## 2016-10-11 MED ORDER — LOSARTAN-HYDROCHLOROTHIAZIDE 50 MG-12.5 MG TAB
ORAL_TABLET | ORAL | 3 refills | Status: DC
Start: 2016-10-11 — End: 2017-07-07

## 2016-11-21 MED ORDER — METFORMIN SR 500 MG 24 HR TABLET
500 mg | ORAL_TABLET | ORAL | 3 refills | Status: DC
Start: 2016-11-21 — End: 2017-10-31

## 2016-12-30 ENCOUNTER — Encounter

## 2016-12-30 MED ORDER — ONETOUCH ULTRASOFT LANCETS
3 refills | Status: AC
Start: 2016-12-30 — End: ?

## 2017-01-10 MED ORDER — SIMVASTATIN 20 MG TAB
20 mg | ORAL_TABLET | ORAL | 3 refills | Status: DC
Start: 2017-01-10 — End: 2018-01-02

## 2017-01-19 ENCOUNTER — Encounter: Attending: Internal Medicine | Primary: Family Medicine

## 2017-01-20 ENCOUNTER — Encounter: Attending: Internal Medicine | Primary: Family Medicine

## 2017-01-20 LAB — CVD REPORT

## 2017-01-20 LAB — METABOLIC PANEL, COMPREHENSIVE
A-G Ratio: 2.1 (ref 1.2–2.2)
ALT (SGPT): 37 IU/L (ref 0–44)
AST (SGOT): 25 IU/L (ref 0–40)
Albumin: 4.2 g/dL (ref 3.5–4.8)
Alk. phosphatase: 64 IU/L (ref 39–117)
BUN/Creatinine ratio: 15 (ref 10–24)
BUN: 17 mg/dL (ref 8–27)
Bilirubin, total: 0.9 mg/dL (ref 0.0–1.2)
CO2: 25 mmol/L (ref 20–29)
Calcium: 9.1 mg/dL (ref 8.6–10.2)
Chloride: 101 mmol/L (ref 96–106)
Creatinine: 1.1 mg/dL (ref 0.76–1.27)
GFR est AA: 74 mL/min/{1.73_m2} (ref >59)
GFR est non-AA: 64 mL/min/{1.73_m2} (ref >59)
GLOBULIN, TOTAL: 2 g/dL (ref 1.5–4.5)
Glucose: 160 mg/dL — ABNORMAL HIGH (ref 65–99)
Potassium: 3.8 mmol/L (ref 3.5–5.2)
Protein, total: 6.2 g/dL (ref 6.0–8.5)
Sodium: 141 mmol/L (ref 134–144)

## 2017-01-20 LAB — DIABETES PATIENT EDUCATION

## 2017-01-20 LAB — HEMOGLOBIN A1C WITH EAG
Estimated average glucose: 140 mg/dL
Hemoglobin A1c: 6.5 % — ABNORMAL HIGH (ref 4.8–5.6)

## 2017-01-20 LAB — MICROALBUMIN, UR, RAND W/ MICROALB/CREAT RATIO
Creatinine, urine random: 107.8 mg/dL
Microalb/Creat ratio (ug/mg creat.): <2.8 mg/g creat (ref 0.0–30.0)
Microalbumin, urine: <3 ug/mL

## 2017-01-20 LAB — LIPID PANEL
Cholesterol, total: 137 mg/dL (ref 100–199)
HDL Cholesterol: 42 mg/dL (ref >39)
LDL, calculated: 71 mg/dL (ref 0–99)
Triglyceride: 121 mg/dL (ref 0–149)
VLDL, calculated: 24 mg/dL (ref 5–40)

## 2017-04-11 HISTORY — PX: CYSTOSCOPY WITH INSERTION OF UROLIFT: SHX6678

## 2017-07-04 ENCOUNTER — Encounter: Attending: Cardiovascular Disease | Primary: Family Medicine

## 2017-07-07 MED ORDER — HYDROCHLOROTHIAZIDE 12.5 MG TAB
12.5 mg | ORAL_TABLET | Freq: Every day | ORAL | 11 refills | Status: DC
Start: 2017-07-07 — End: 2018-01-02

## 2017-07-07 MED ORDER — LOSARTAN 50 MG TAB
50 mg | ORAL_TABLET | Freq: Every day | ORAL | 11 refills | Status: DC
Start: 2017-07-07 — End: 2018-01-02

## 2017-07-07 NOTE — Telephone Encounter (Signed)
Sent him the following message through MyChart:    We received a fax from your pharmacy that the combo pill losartan/hctz is on back order so they asked for us to send the individual tabs to them so I sent a 30 day supply of losartan 50 mg tabs and hctz 12.5 mg tabs to take 1 tab daily of each. I put refills on this in case the backorder is longer than 1 month.

## 2017-07-10 NOTE — Telephone Encounter (Signed)
Please call pt to let him know he has an unread message in MyChart.    We received a fax from your pharmacy that the combo pill losartan/hctz is on back order so they asked for us to send the individual tabs to them so I sent a 30 day supply of losartan 50 mg tabs and hctz 12.5 mg tabs to take 1 tab daily of each. I put refills on this in case the backorder is longer than 1 month.

## 2017-07-10 NOTE — Telephone Encounter (Signed)
This nurse tried to call this patient  No answer  Left a voice message for a return call and to notify him that his medication Losartan/HCTZ was on back order.  Per Dr. Laural BenesJohnson he has ordered the individual pills.  He is to take 1 tablet of each daily.  Sula Rumpleerry L Aliece Honold, LPN

## 2017-07-27 ENCOUNTER — Encounter: Attending: Internal Medicine | Primary: Family Medicine

## 2017-08-10 ENCOUNTER — Ambulatory Visit: Admit: 2017-08-10 | Payer: MEDICARE | Attending: Cardiovascular Disease | Primary: Family Medicine

## 2017-08-10 DIAGNOSIS — I10 Essential (primary) hypertension: Secondary | ICD-10-CM
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
## 2017-08-10 NOTE — Progress Notes (Signed)
[address] [place] [address]  [id number]     Subjective:      Walter Molina is a 77 y.o. male is here for routine f/u.  The patient denies chest pain/ shortness of breath, orthopnea, PND, LE edema, palpitations, syncope, or presyncope.       Patient Active Problem List    Diagnosis Date Noted   ??? Type 2 diabetes with nephropathy ([place]) 07/28/2016   ??? Knee osteoarthritis 02/18/2014   ??? Palpitations 12/19/2013   ??? Controlled type 2 diabetes mellitus without complication, without long-term current use of insulin ([place])    ??? Essential hypertension, benign    ??? Hyperlipidemia with target LDL less than 100       Other, Phys, MD  Past Medical History:   Diagnosis Date   ??? Arthritis     knees   ??? GERD (gastroesophageal reflux disease)    ??? Hypertension    ??? Ill-defined condition     high cholesterol   ??? Other and unspecified hyperlipidemia    ??? Other ill-defined conditions(799.89)     diverticular disease/colon   ??? Seasonal allergic rhinitis    ??? Type II or unspecified type diabetes mellitus without mention of complication, not stated as uncontrolled     borderline      Past Surgical History:   Procedure Laterality Date   ??? ABDOMEN SURGERY PROC UNLISTED      colectomy/12 years ago   ??? COLONOSCOPY  10/10/2008        ??? HX APPENDECTOMY     ??? HX CATARACT REMOVAL Bilateral 9/13   ??? HX ORTHOPAEDIC      bunionectomy b/l   ??? HX ORTHOPAEDIC  07/24/13    right 2nd toe had bone spur removed   ??? HX ORTHOPAEDIC  02/18/14    LEFT TOTAL KNEE ARTHROPLASTY WITH NAVIGATION   ??? HX TONSILLECTOMY       Allergies   Allergen Reactions   ??? Penicillins Rash   ??? Tamsulosin Other (comments)     dizziness, nausea, and raising blood sugar level      Family History   Problem Relation Age of Onset   ??? Diabetes Father    ??? Stroke Father 25   ??? Hypertension Mother    ??? Diabetes Other         cousin   ??? Heart Disease Neg Hx       Social History     Socioeconomic History   ??? Marital status: MARRIED      Spouse name: Not on file   ??? Number of children: Not on file   ??? Years of education: Not on file   ??? Highest education level: Not on file   Occupational History   ??? Not on file   Social Needs   ??? Financial resource strain: Not on file   ??? Food insecurity:     Worry: Not on file     Inability: Not on file   ??? Transportation needs:     Medical: Not on file     Non-medical: Not on file   Tobacco Use   ??? Smoking status: Never Smoker   ??? Smokeless tobacco: Never Used   Substance and Sexual Activity   ??? Alcohol use: Yes     Comment: very rarely may have a beer or glass of wine   ??? Drug use: No   ??? Sexual activity: Not on file   Lifestyle   ??? Physical activity:  Days per week: Not on file     Minutes per session: Not on file   ??? Stress: Not on file   Relationships   ??? Social connections:     Talks on phone: Not on file     Gets together: Not on file     Attends religious service: Not on file     Active member of club or organization: Not on file     Attends meetings of clubs or organizations: Not on file     Relationship status: Not on file   ??? Intimate partner violence:     Fear of current or ex partner: Not on file     Emotionally abused: Not on file     Physically abused: Not on file     Forced sexual activity: Not on file   Other Topics Concern   ??? Not on file   Social History Narrative    Lives in [place] with wife of 9 years.  Has 2 sons from a previous marriage.    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    Likes to work in the yard.        Current Outpatient Medications   Medication Sig   ??? losartan (COZAAR) 50 mg tablet Take 1 Tab by mouth daily.   ??? hydroCHLOROthiazide (HYDRODIURIL) 12.5 mg tablet Take 1 Tab by mouth daily.   ??? simvastatin (ZOCOR) 20 mg tablet TAKE 1 TABLET BY MOUTH NIGHTLY   ??? ONETOUCH ULTRASOFT LANCETS misc TEST ONCE DAILY   ??? metFORMIN ER (GLUCOPHAGE XR) 500 mg tablet TAKE 2 TABS WITH BREAKFAST AND 2 TABS WITH DINNER    ??? ONETOUCH ULTRA BLUE TEST STRIP strip TEST UP TO ONCE DAILY. DX. E11.9   ??? ONETOUCH ULTRASOFT LANCETS misc TEST ONCE DAILY. DX E11.9   ??? ONETOUCH ULTRA TEST strip TEST UP TO ONCE DAILY. DX E11.9   ??? pantoprazole (PROTONIX) 40 mg tablet Take 40 mg by mouth daily.     No current facility-administered medications for this visit.          Review of Symptoms:  11 systems reviewed, negative other than as stated in the HPI    Physical ExamPhysical Exam: ??  Vitals:    08/10/17 1541 08/10/17 1553   BP: 120/70 120/72   Pulse: 63    Resp: 16    SpO2: 93%    Weight: 220 lb 8 oz (100 kg)    Height: '5\' 11"'  (1.803 m)      Body mass index is 30.75 kg/m??.  General PE   Gen:  NAD  Mental Status - Alert. General Appearance - Not in acute distress.   Chest and Lung Exam   Inspection: Accessory muscles - No use of accessory muscles in breathing.   Auscultation:   Breath sounds: - Normal.   Cardiovascular   Inspection: Jugular vein - Bilateral - Inspection Normal.   Palpation/Percussion:   Apical Impulse: - Normal.   Auscultation: Rhythm - Regular. Heart Sounds - S1 WNL and S2 WNL. No S3 or S4.   Murmurs & Other Heart Sounds: Auscultation of the heart reveals - No Murmurs.   Peripheral Vascular   Upper Extremity: Inspection - Bilateral - No Cyanotic nailbeds or Digital clubbing.   Lower Extremity:   Palpation: Edema - Bilateral - No edema.  Abdomen:?? ??Soft, non-tender, bowel sounds are active.  Neuro: A&O times 3, CN and motor grossly WNL    Labs:  Lab Results   Component Value Date/Time    Cholesterol, total 137 01/19/2017 07:39 AM    Cholesterol, total 129 07/21/2016 08:26 AM    Cholesterol, total 119 12/04/2015 07:55 AM    Cholesterol, total 139 05/18/2015 08:02 AM    Cholesterol, total 142 11/11/2014 08:18 AM    HDL Cholesterol 42 01/19/2017 07:39 AM    HDL Cholesterol 22 (L) 07/21/2016 08:26 AM    HDL Cholesterol 34 (L) 12/04/2015 07:55 AM    HDL Cholesterol 35 (L) 05/18/2015 08:02 AM     HDL Cholesterol 32 (L) 11/11/2014 08:18 AM    LDL, calculated 71 01/19/2017 07:39 AM    LDL, calculated 83 07/21/2016 08:26 AM    LDL, calculated 64 12/04/2015 07:55 AM    LDL, calculated 71 05/18/2015 08:02 AM    LDL, calculated 71 11/11/2014 08:18 AM    Triglyceride 121 01/19/2017 07:39 AM    Triglyceride 122 07/21/2016 08:26 AM    Triglyceride 104 12/04/2015 07:55 AM    Triglyceride 164 (H) 05/18/2015 08:02 AM    Triglyceride 195 (H) 11/11/2014 08:18 AM     Lab Results   Component Value Date/Time    CK 86 11/10/2013 12:50 PM     Lab Results   Component Value Date/Time    Sodium 141 01/19/2017 07:39 AM    Potassium 3.8 01/19/2017 07:39 AM    Chloride 101 01/19/2017 07:39 AM    CO2 25 01/19/2017 07:39 AM    Anion gap 9 02/19/2014 04:33 AM    Glucose 160 (H) 01/19/2017 07:39 AM    BUN 17 01/19/2017 07:39 AM    Creatinine 1.10 01/19/2017 07:39 AM    BUN/Creatinine ratio 15 01/19/2017 07:39 AM    GFR est AA 74 01/19/2017 07:39 AM    GFR est non-AA 64 01/19/2017 07:39 AM    Calcium 9.1 01/19/2017 07:39 AM    Bilirubin, total 0.9 01/19/2017 07:39 AM    AST (SGOT) 25 01/19/2017 07:39 AM    Alk. phosphatase 64 01/19/2017 07:39 AM    Protein, total 6.2 01/19/2017 07:39 AM    Albumin 4.2 01/19/2017 07:39 AM    Globulin 3.1 02/03/2014 10:08 AM    A-G Ratio 2.1 01/19/2017 07:39 AM    ALT (SGPT) 37 01/19/2017 07:39 AM       EKG:  NSR      Assessment:     Assessment:      1. Essential hypertension, benign    2. Hyperlipidemia with target LDL less than 100    3. Controlled type 2 diabetes mellitus without complication, without long-term current use of insulin ()        Orders Placed This Encounter   ??? AMB POC EKG ROUTINE W/ 12 LEADS, INTER & REP     Order Specific Question:   Reason for Exam:     Answer:   ROUTINE        Plan:     Patient presents for one year f/u, doing well and stable from cardiac standpoint.  Retired 9/18.  Trying to stay active.  Goes to the gym or walks in their neighborhood most days of the week.     Hypertension: Controlled 120/72  Normal EF with no significant valvular pathology per echo 5/18.    Type 2 diabetes: Followed by endocrinology. On Metformin  Hyperlipidemia: 10/18 LDL 71. On statin. Followed by endocrinologist    Will dc ASA--no CAD/CVA     Continue current care and f/u in 1 yr  Whitney Muse, NP       [facility]    08/10/2017         Patient seen, examined by me personally. Plan discussed as detailed. Agree with note as outlined by  NP. I confirm findings in history and physical exam. No additional findings noted. Agree with plan as outlined above. He is moving to [place]. Advised to find a local cardiologist.    Aryam Zhan Ramond Dial, MD

## 2017-08-24 ENCOUNTER — Encounter

## 2017-09-22 MED ORDER — ONETOUCH ULTRA BLUE TEST STRIP
ORAL_STRIP | 3 refills | Status: AC
Start: 2017-09-22 — End: ?

## 2017-09-29 ENCOUNTER — Encounter: Attending: Internal Medicine | Primary: Family Medicine

## 2017-10-29 ENCOUNTER — Emergency Department (HOSPITAL_COMMUNITY)
Admission: EM | Admit: 2017-10-29 | Discharge: 2017-10-29 | Disposition: A | Discharge status: Home / Self Care | Payer: Medicare Other | Attending: Emergency Medicine | Admitting: Emergency Medicine

## 2017-10-29 ENCOUNTER — Emergency Department (HOSPITAL_COMMUNITY): Payer: Medicare Other

## 2017-10-29 ENCOUNTER — Encounter (HOSPITAL_COMMUNITY): Payer: Self-pay | Admitting: Obstetrics and Gynecology

## 2017-10-29 ENCOUNTER — Other Ambulatory Visit: Payer: Self-pay

## 2017-10-29 DIAGNOSIS — N50819 Testicular pain, unspecified: Secondary | ICD-10-CM

## 2017-10-29 DIAGNOSIS — N433 Hydrocele, unspecified: Secondary | ICD-10-CM

## 2017-10-29 DIAGNOSIS — R2241 Localized swelling, mass and lump, right lower limb: Secondary | ICD-10-CM | POA: Diagnosis present

## 2017-10-29 HISTORY — DX: Urinary tract infection, site not specified: N39.0

## 2017-10-29 HISTORY — DX: Type 2 diabetes mellitus without complications: E11.9

## 2017-10-29 LAB — BASIC METABOLIC PANEL
ANION GAP: 8 (ref 5–15)
BUN: 20 mg/dL (ref 8–23)
CHLORIDE: 105 mmol/L (ref 98–111)
CO2: 27 mmol/L (ref 22–32)
CREATININE: 1.03 mg/dL (ref 0.61–1.24)
Calcium: 9.2 mg/dL (ref 8.9–10.3)
GFR calc non Af Amer: >60 mL/min (ref >60)
Glucose, Bld: 164 mg/dL — ABNORMAL HIGH (ref 70–99)
Potassium: 3.7 mmol/L (ref 3.5–5.1)
Sodium: 140 mmol/L (ref 135–145)

## 2017-10-29 LAB — CBC WITH DIFFERENTIAL/PLATELET
Basophils Absolute: 0 10*3/uL (ref 0.0–0.1)
Basophils Relative: 0 %
Eosinophils Absolute: 0.1 10*3/uL (ref 0.0–0.7)
Eosinophils Relative: 1 %
HEMATOCRIT: 38.7 % — AB (ref 39.0–52.0)
HEMOGLOBIN: 13.3 g/dL (ref 13.0–17.0)
LYMPHS ABS: 1.6 10*3/uL (ref 0.7–4.0)
Lymphocytes Relative: 24 %
MCH: 33.1 pg (ref 26.0–34.0)
MCHC: 34.4 g/dL (ref 30.0–36.0)
MCV: 96.3 fL (ref 78.0–100.0)
MONOS PCT: 6 %
Monocytes Absolute: 0.4 10*3/uL (ref 0.1–1.0)
NEUTROS ABS: 4.8 10*3/uL (ref 1.7–7.7)
NEUTROS PCT: 69 %
Platelets: 216 10*3/uL (ref 150–400)
RBC: 4.02 MIL/uL — ABNORMAL LOW (ref 4.22–5.81)
RDW: 13.2 % (ref 11.5–15.5)
WBC: 6.9 10*3/uL (ref 4.0–10.5)

## 2017-10-29 LAB — URINALYSIS, ROUTINE W REFLEX MICROSCOPIC
Bacteria, UA: NONE SEEN
Bilirubin Urine: NEGATIVE
GLUCOSE, UA: NEGATIVE mg/dL
Ketones, ur: NEGATIVE mg/dL
Leukocytes, UA: NEGATIVE
Nitrite: NEGATIVE
Protein, ur: NEGATIVE mg/dL
Specific Gravity, Urine: 1.006 (ref 1.005–1.030)
pH: 5 (ref 5.0–8.0)

## 2017-10-29 NOTE — ED Provider Notes (Signed)
[name] Provider Note   CSN: [id number] Arrival date & time: 10/29/17  1432   History   Chief Complaint Chief Complaint  Patient presents with  . Groin Swelling    HPI Gabriel Mcpherson is a 77 y.o. male who presents with R sided groin and testicular swelling. PMH significant for recent UTI requiring hospitalization and foley catheter insertion, DM, HTN, HLD. The patient states that he was running a fever and felt off balance last week. They went to [facility] and he was admitted for several days for what sounds like a complicated UTI. He received IV antibiotics and had a catheter inserted. He was discharged with Cipro. They were given follow up with [facility]. He finished his last dose of antibiotics yesterday. He states he noticed the discomfort yesterday. His wife has been cleaning the foley and noticed swelling in the right groin and testicular area. He states this has worsened today but he denies severe pain. No fever, chills, abdominal pain, N/V, flank pain. He reports seeing a urologist years ago. He has had kidney stones in the past and has BPH with chronic urinary frequency.   HPI  Past Medical History:  Diagnosis Date  . UTI (urinary tract infection)     There are no active problems to display for this patient.   The histories are not reviewed yet. Please review them in the "History" navigator section and refresh this SmartLink.      Home Medications    Prior to Admission medications   Not on File    Family History No family history on file.  Social History Social History   Tobacco Use  . Smoking status: Not on file  Substance Use Topics  . Alcohol use: Not Currently  . Drug use: Not Currently     Allergies   Patient has no allergy information on record.   Review of Systems Review of Systems  Constitutional: Negative for chills and fever.  Respiratory: Negative for shortness of breath.     Cardiovascular: Negative for chest pain.  Gastrointestinal: Negative for abdominal pain, nausea and vomiting.  Genitourinary: Positive for penile swelling and scrotal swelling. Negative for decreased urine volume, difficulty urinating, dysuria, flank pain, penile pain and testicular pain.  All other systems reviewed and are negative.    Physical Exam Updated Vital Signs BP (!) 141/80 (BP Location: Left Arm)   Pulse (!) 105   Temp 98.3 F (36.8 C) (Oral)   Resp 16   SpO2 95%   Physical Exam  Constitutional: He is oriented to person, place, and time. He appears well-developed and well-nourished. No distress.  Calm and cooperative  HENT:  Head: Normocephalic and atraumatic.  Eyes: Pupils are equal, round, and reactive to light. Conjunctivae are normal. Right eye exhibits no discharge. Left eye exhibits no discharge. No scleral icterus.  Neck: Normal range of motion.  Cardiovascular: Normal rate and regular rhythm.  Pulmonary/Chest: Effort normal and breath sounds normal. No respiratory distress.  Abdominal: Soft. Bowel sounds are normal. He exhibits no distension and no mass. There is tenderness (very minimal tenderness in RLQ). There is no rebound and no guarding. No hernia.  Abdominal surgical scar from diverticulitis per patient  Genitourinary:  Genitourinary Comments: No inguinal lymphadenopathy or inguinal hernia noted. Normal circumcised penis free of lesions or rash. Foley is in place. Testicles are nontender with normal lie. They are slightly enlarged but not significantly. Normal scrotal appearance. No obvious discharge noted. Chaperone (Dr. Estell Harpin) present  during exam.    Neurological: He is alert and oriented to person, place, and time.  Skin: Skin is warm and dry.  Psychiatric: He has a normal mood and affect. His behavior is normal.  Nursing note and vitals reviewed.    ED Treatments / Results  Labs (all labs ordered are listed, but only abnormal results are  displayed) Labs Reviewed  BASIC METABOLIC PANEL - Abnormal; Notable for the following components:      Result Value   Glucose, Bld 164 (*)    All other components within normal limits  CBC WITH DIFFERENTIAL/PLATELET - Abnormal; Notable for the following components:   RBC 4.02 (*)    HCT 38.7 (*)    All other components within normal limits  URINALYSIS, ROUTINE W REFLEX MICROSCOPIC - Abnormal; Notable for the following components:   Hgb urine dipstick SMALL (*)    All other components within normal limits    EKG None  Radiology [place] Scrotum  Result Date: 10/29/2017 CLINICAL DATA:  Scrotal swelling EXAM: ULTRASOUND OF SCROTUM TECHNIQUE: Complete ultrasound examination of the testicles, epididymis, and other scrotal structures was performed. COMPARISON:  None. FINDINGS: Right testicle Measurements: 4.7 x 3.0 x 3.1 cm. No mass or microlithiasis visualized. Left testicle Measurements: 3.8 x 2.6 x 2.9 cm. No mass or microlithiasis visualized. Right epididymis: Normal in size and appearance. No inflammatory focus evident. Left epididymis: Normal in size and appearance. No inflammatory focus evident. Hydrocele: There are bilateral hydroceles, larger on the right than on the left. Varicocele: There is a small varicocele on the left with Valsalva maneuver. No scrotal abscess or scrotal wall thickening evident. IMPRESSION: Bilateral hydroceles, larger on the right than on the left. Small left varicocele. No intratesticular or extratesticular mass evident. No epididymitis or orchitis on either side evident. Electronically Signed   By: Bretta BangWilliam  Woodruff III M.D.   On: 10/29/2017 17:32    Procedures Procedures (including critical care time)  Medications Ordered in ED Medications - No data to display   Initial Impression / Assessment and Plan / ED Course  I have reviewed the triage vital signs and the nursing notes.  Pertinent labs & imaging results that were available during my care of the patient  were reviewed by me and considered in my medical decision making (see chart for details).  77 year old who presents with testicular and groin swelling for the past day. No significant pain. He is mildly tachycardic and hypertensive which has improved on recheck. He is not in any distress on exam. Abdomen is soft and minimally tender. Testicles appear mildly swollen. Shared visit with Dr. Estell HarpinZammit. Will obtain labs, UA, Scrotal US.  Labs are normal.  Urine is clear.  Scrotal ultrasound is consistent with bilateral hydroceles with the right bigger than the left.  Discussed results with the patient.  Advised conservative treatment and urology follow-up.  They will follow-up on Tuesday  Final Clinical Impressions(s) / ED Diagnoses   Final diagnoses:  Hydrocele, unspecified hydrocele type    ED Discharge Orders    None       Bethel BornGekas, Irvin Lizama Marie, PA-C 10/29/17 1800    Bethann BerkshireZammit, Joseph, MD 10/30/17 2323

## 2017-10-29 NOTE — Discharge Instructions (Signed)
Please follow up with Urology on Tuesday. Return if you are worsening

## 2017-10-29 NOTE — ED Triage Notes (Signed)
Pt reports he had a UTI and a catheter was placed and pt was prescribed cipro. Pt reports he took his last dose of cipro yesterday but he is having testicle swelling and irritation in the area of the catheter.  Pt denies severe pain.

## 2017-10-31 ENCOUNTER — Encounter

## 2017-10-31 ENCOUNTER — Other Ambulatory Visit (HOSPITAL_COMMUNITY): Payer: Self-pay | Admitting: Medical

## 2017-10-31 DIAGNOSIS — N50819 Testicular pain, unspecified: Secondary | ICD-10-CM

## 2017-10-31 MED ORDER — ONETOUCH ULTRASOFT LANCETS
3 refills | Status: AC
Start: 2017-10-31 — End: ?

## 2017-11-01 MED ORDER — METFORMIN SR 500 MG 24 HR TABLET
500 mg | ORAL_TABLET | ORAL | 3 refills | Status: DC
Start: 2017-11-01 — End: 2018-02-01

## 2017-12-04 NOTE — Telephone Encounter (Signed)
-----   Message from Vicie Muttersarol P Lewis sent at 12/04/2017 10:50 AM EDT -----  Cancelled patient's appointment.      ----- Message -----  From: Carylon PerchesJohnson, Eniya Cannady A, MD  Sent: 12/04/2017  10:42 AM EDT  To: Vicie Muttersarol P Lewis    Please cancel his visit in October as he won't be coming to our practice

## 2017-12-06 NOTE — Telephone Encounter (Signed)
lmtrc x1

## 2017-12-06 NOTE — Telephone Encounter (Signed)
Please call them and let them know he does not need to be seen urgently and I have not seen him since last year and what we faxed is a summary of the last visit that includes all of his labs since he has been seeing me in the assessment/plan of the note.

## 2017-12-06 NOTE — Telephone Encounter (Signed)
12/06/2017  12:35 PM      Walter Molina [facility] stating that Dr. Leslie DalesAltheimer new patient appointment is not available until late October. She stated that he is also limiting how many new patient he is seeing however they do have other doctors available and a Nurse Practitioner that he can see. She would like for more labs results to be sent to the offices regarding this patient. Call back number is [phone].    Thanks

## 2017-12-07 NOTE — Telephone Encounter (Signed)
Left message for Wilkie AyeKristy to call and [facility].

## 2017-12-07 NOTE — Telephone Encounter (Signed)
I'm confused.  As a specialist, I can't provide a referral.  Is there something specific they are looking for from me?

## 2017-12-07 NOTE — Telephone Encounter (Signed)
Lorene Dyhristie, with [facility] returned your call.  She actually faxed over a request this morning.   She is asking for an actual referral from out office.  (paperwork received this morning.)

## 2017-12-08 NOTE — Telephone Encounter (Addendum)
Lm x2

## 2017-12-12 NOTE — Telephone Encounter (Signed)
Lm x3

## 2018-01-02 MED ORDER — HYDROCHLOROTHIAZIDE 12.5 MG TAB
12.5 mg | ORAL_TABLET | ORAL | 0 refills | Status: DC
Start: 2018-01-02 — End: 2018-03-30

## 2018-01-02 MED ORDER — LOSARTAN 50 MG TAB
50 mg | ORAL_TABLET | ORAL | 0 refills | Status: DC
Start: 2018-01-02 — End: 2018-01-03

## 2018-01-02 MED ORDER — SIMVASTATIN 20 MG TAB
20 mg | ORAL_TABLET | ORAL | 0 refills | Status: DC
Start: 2018-01-02 — End: 2018-03-30

## 2018-01-02 NOTE — Telephone Encounter (Signed)
Walter Molina has never returned my call.

## 2018-01-02 NOTE — Telephone Encounter (Signed)
I received a fax from [place] local [facility] [place] [phone] stating that losartan 50 mg tabs are on backorder.  Can you find out if they have the 25 or 100 mg tabs?  If not, what med in the same class do they have that is covered (valsartan, irbesartan or candesartan)

## 2018-01-03 MED ORDER — LOSARTAN 100 MG TAB
100 mg | ORAL_TABLET | ORAL | 3 refills | Status: AC
Start: 2018-01-03 — End: ?

## 2018-01-03 NOTE — Telephone Encounter (Signed)
Sent him the following message through MyChart:    We received a fax that the losartan 50 mg tabs are on backorder but they have plenty of the 100 mg tabs so I sent a prescription for these tabs to take 1/2 tab daily to your local [facility].  Take care.

## 2018-01-03 NOTE — Telephone Encounter (Signed)
Per pharmacy tech they do have plenty of the losartan 100 mg tablets.

## 2018-01-04 NOTE — Telephone Encounter (Signed)
Please call pt to let him know he has an unread message in MyChart.    We received a fax that the losartan 50 mg tabs are on backorder but they have plenty of the 100 mg tabs so I sent a prescription for these tabs to take 1/2 tab daily to [facility] local [facility]. ??Take care.

## 2018-01-04 NOTE — Telephone Encounter (Signed)
lmtrc x1

## 2018-01-18 ENCOUNTER — Encounter

## 2018-01-19 DIAGNOSIS — L97521 Non-pressure chronic ulcer of other part of left foot limited to breakdown of skin: Secondary | ICD-10-CM

## 2018-01-19 DIAGNOSIS — E119 Type 2 diabetes mellitus without complications: Secondary | ICD-10-CM | POA: Insufficient documentation

## 2018-01-19 DIAGNOSIS — E08621 Diabetes mellitus due to underlying condition with foot ulcer: Secondary | ICD-10-CM | POA: Insufficient documentation

## 2018-02-01 MED ORDER — METFORMIN SR 500 MG 24 HR TABLET
500 mg | ORAL_TABLET | ORAL | 0 refills | Status: AC
Start: 2018-02-01 — End: ?

## 2018-02-02 ENCOUNTER — Encounter: Attending: Internal Medicine | Primary: Family Medicine

## 2018-02-08 ENCOUNTER — Ambulatory Visit (INDEPENDENT_AMBULATORY_CARE_PROVIDER_SITE_OTHER): Payer: Medicare Other

## 2018-02-08 ENCOUNTER — Ambulatory Visit (INDEPENDENT_AMBULATORY_CARE_PROVIDER_SITE_OTHER): Payer: Medicare Other | Admitting: Podiatry

## 2018-02-08 DIAGNOSIS — M19071 Primary osteoarthritis, right ankle and foot: Secondary | ICD-10-CM | POA: Diagnosis not present

## 2018-02-08 DIAGNOSIS — I10 Essential (primary) hypertension: Secondary | ICD-10-CM | POA: Insufficient documentation

## 2018-02-08 DIAGNOSIS — E119 Type 2 diabetes mellitus without complications: Secondary | ICD-10-CM | POA: Diagnosis not present

## 2018-02-08 DIAGNOSIS — M2042 Other hammer toe(s) (acquired), left foot: Secondary | ICD-10-CM | POA: Diagnosis not present

## 2018-02-08 DIAGNOSIS — E785 Hyperlipidemia, unspecified: Secondary | ICD-10-CM | POA: Insufficient documentation

## 2018-02-08 DIAGNOSIS — M779 Enthesopathy, unspecified: Secondary | ICD-10-CM

## 2018-02-08 DIAGNOSIS — L84 Corns and callosities: Secondary | ICD-10-CM

## 2018-02-08 NOTE — Patient Instructions (Signed)

## 2018-02-08 NOTE — Progress Notes (Signed)
Subjective:    Patient ID: Gabriel Mcpherson, male    DOB: [birth year], 77 y.o.   MRN: [id number]  HPI 77 year old male presents the office today for concerns of calluses and blisters between his left fourth and fifth toes which is been ongoing for the last 2 months.  States the toes rub together causing discomfort.  He also states he had some right ankle pain intermittently for the last year and occasionally get some swelling.  No recent injury or trauma to his feet.  He is diabetic and last A1c was 6.  He has no other concerns.   Review of Systems  All other systems reviewed and are negative.  Past Medical History:  Diagnosis Date  . Diabetes mellitus ([facility])   . UTI (urinary tract infection)        Current Outpatient Medications:  .  hydrochlorothiazide (HYDRODIURIL) 12.5 MG tablet, Take by mouth., Disp: , Rfl:  .  losartan (COZAAR) 100 MG tablet, Take by mouth., Disp: , Rfl:  .  nitrofurantoin (MACRODANTIN) 100 MG capsule, Take by mouth., Disp: , Rfl:  .  simvastatin (ZOCOR) 20 MG tablet, Take by mouth., Disp: , Rfl:  .  cephALEXin (KEFLEX) 500 MG capsule, TAKE 1 CAPSULE BY MOUTH EVERY 6 HOURS, Disp: , Rfl: 0 .  ciprofloxacin (CIPRO) 500 MG tablet, Take 500 mg by mouth every 12 (twelve) hours., Disp: , Rfl: 0 .  clindamycin (CLEOCIN) 300 MG capsule, TK ONE C PO BID, Disp: , Rfl: 0 .  doxycycline (VIBRAMYCIN) 100 MG capsule, TAKE 1 CAPSULE BY MOUTH TWICE A DAY FOR 10 DAYS, Disp: , Rfl: 0 .  glipiZIDE (GLUCOTROL) 5 MG tablet, TAKE 1 TABLET BY MOUTH TWICE A DAY FOR 14 DAYS, Disp: , Rfl: 0 .  meloxicam (MOBIC) 15 MG tablet, Take 1 tablet (15 mg total) by mouth daily., Disp: 30 tablet, Rfl: 0 .  metFORMIN (GLUCOPHAGE-XR) 500 MG 24 hr tablet, TAKE 2 TABS WITH BREAKFAST AND 2 TABS WITH DINNER, Disp: , Rfl: 0 .  mupirocin ointment (BACTROBAN) 2 %, APPLY A SMALL AMOUNT TO THE AFFECTED AREA BY TOPICAL ROUTE 3 TIMES PER DAY X 7 DAYS, Disp: , Rfl: 0 .  pantoprazole (PROTONIX) 40 MG tablet, Take by  mouth., Disp: , Rfl:  .  tamsulosin (FLOMAX) 0.4 MG CAPS capsule, Take 0.8 mg by mouth daily., Disp: , Rfl: 11  Allergies  Allergen Reactions  . Penicillins Rash         Objective:   Physical Exam  General: AAO x3, NAD  Dermatological: Keratotic lesions left fourth and fifth toes upon debridement there is no underlying ulceration, drainage or any clinical signs of infection noted today.  There are no open sores, no preulcerative lesions, no rash or signs of infection present.  Vascular: Dorsalis Pedis artery and Posterior Tibial artery pedal pulses are 2/4 bilateral with immedate capillary fill time.  There is no pain with calf compression, swelling, warmth, erythema.   Neruologic: Grossly intact via light touch bilateral. Protective threshold with Semmes Wienstein monofilament intact to all pedal sites bilateral.   Musculoskeletal: Hammertoes, adductovarus of the digits but this is a hyperkeratotic lesions in between the fourth and fifth toes in the left foot.  Bony prominence is present to the dorsal aspect the right foot there is mild tenderness palpation of the anterior ankle joint line.  There is no area pinpoint bony tenderness or pain to vibratory sensation.  Achilles tendon, flexor, extensor tendons are intact bilaterally.  Muscular strength 5/5  in all groups tested bilateral.  Gait: Unassisted, Nonantalgic.     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  Arthritic changes present the right midfoot.  No evidence of acute fracture identified at this time. -Etiology of symptoms were discussed -I sharply debrided the hyperkeratotic lesions left foot without any complications or bleeding.  Toe separators were dispensed. -Prescribed mobic. Discussed side effects of the  medication and directed to stop if any are to occur and call the office.  -Discussed shoe modifications and orthotics.  Vivi Barrack DPM

## 2018-02-12 ENCOUNTER — Telehealth: Payer: Self-pay | Admitting: Podiatry

## 2018-02-12 MED ORDER — MELOXICAM 15 MG PO TABS: 15.0000 mg | ORAL_TABLET | Freq: Every day | ORAL | 0 refills | Status: AC

## 2018-02-12 NOTE — Telephone Encounter (Signed)
pts wife called and stated pts rx did not go thru when he was in last Thursday. Pt is on his way with his wife and they have a quick question for you also.Marland KitchenMarland Kitchen

## 2018-03-30 MED ORDER — HYDROCHLOROTHIAZIDE 12.5 MG TAB
12.5 mg | ORAL_TABLET | ORAL | 0 refills | Status: AC
Start: 2018-03-30 — End: ?

## 2018-03-30 MED ORDER — SIMVASTATIN 20 MG TAB
20 mg | ORAL_TABLET | Freq: Every evening | ORAL | 0 refills | Status: DC
Start: 2018-03-30 — End: 2018-05-04

## 2018-05-04 MED ORDER — SIMVASTATIN 20 MG TAB
20 mg | ORAL_TABLET | Freq: Every evening | ORAL | 0 refills | Status: AC
Start: 2018-05-04 — End: ?

## 2019-10-25 ENCOUNTER — Other Ambulatory Visit: Payer: Self-pay | Admitting: Urology

## 2019-11-01 ENCOUNTER — Encounter (HOSPITAL_BASED_OUTPATIENT_CLINIC_OR_DEPARTMENT_OTHER): Payer: Self-pay | Admitting: Urology

## 2019-11-01 ENCOUNTER — Other Ambulatory Visit: Payer: Self-pay

## 2019-11-01 NOTE — Progress Notes (Addendum)
ADDENDUM:  Requested and received via fax pt's cardiology lov note dated 10-23-2019, stress echo dated 10-30-2019, and ekg tracing dated 10-28-2019, all placed in chart.   Spoke w/ via phone for pre-op interview--- PT and pt's wife , Gabriel Mcpherson needs dos---- [employer] results------ current ekg w/ cardologist, requested tracing  COVID test ------ 11-05-2019 @ 1010 Arrive at ------- 0930 NPO after MN NO Solid Food.  Clear liquids from MN until--- 0830 then nothing by mouth Medications to take morning of surgery ----- Protonix w/ sips of water Diabetic medication ----- do not take metformin morning of surgery Patient Special Instructions ----- n/a Pre-Op special Istructions ----- requested ekg, stress test, echo, ekg and lov from dr banerjee office via fax Patient verbalized understanding of instructions that were given at this phone interview. Patient denies shortness of breath, chest pain, fever, cough a this phone interview.   Anesthesia Review:  Hx HTN, DM2, Palpiatations.    PCP:  Dr Michel [facility]) Cardiologist :  Dr Catha Gosselin (lov last week per pt and had stress test and echo done) Chest x-ray :   07-23-2016 epic EKG :  07/ 2021 (requested tracing) Stress test:  Per pt had nuclear study 10-28-2019 , told was normal (requested results Echo :  Per pt had echo done 10-30-2019, told was normal (requested results) Cardiac Cath :  Never per pt Activity level:  Denies any sob with any activity Sleep Study/ CPAP :  NO Fasting Blood Sugar :  150--168    / Checks Blood Sugar -- times a day:  Twice daily Blood Thinner/ Instructions /Last Dose: NO ASA / Instructions/ Last Dose :  NO

## 2019-11-05 ENCOUNTER — Other Ambulatory Visit (HOSPITAL_COMMUNITY)
Admission: RE | Admit: 2019-11-05 | Discharge: 2019-11-05 | Disposition: A | Discharge status: Home / Self Care | Payer: Medicare Other | Source: Ambulatory Visit | Attending: Urology | Admitting: Urology

## 2019-11-05 DIAGNOSIS — Z20822 Contact with and (suspected) exposure to covid-19: Secondary | ICD-10-CM | POA: Insufficient documentation

## 2019-11-05 DIAGNOSIS — Z01812 Encounter for preprocedural laboratory examination: Secondary | ICD-10-CM | POA: Diagnosis present

## 2019-11-05 LAB — SARS CORONAVIRUS 2 (TAT 6-24 HRS): SARS Coronavirus 2: NEGATIVE

## 2019-11-07 NOTE — H&P (Signed)
Office Visit Report     10/24/2019   --------------------------------------------------------------------------------   Gabriel Mcpherson  MRN: [id number]  DOB: 1941/03/06, 79 year old Male  SSN: -[id number]   PRIMARY CARE:    REFERRING:  James B. Doyne Keel, MD  PROVIDER:  Jerilee Field, M.D.  LOCATION:  [facility] [phone]     --------------------------------------------------------------------------------   CC: I have symptoms of an enlarged prostate.  HPI: Gabriel Mcpherson is a 79 year-old male established patient who is here for symptoms of enlarged prostate.  He first noticed the symptoms approximately 09/09/2017. His symptoms have not gotten worse over the last year. He has been treated with Flomax. The patient has never had a surgical procedure for bladder outlet obstruction to his prostate.   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 Was on tams BID.   UDS - Max capacity 350 cc  First sensation and 181 cc  Maximum detrusor pressure at peak flow 47 cm H2O  Max flow 2 mL/ second  PVR 314 cc   Cysto Aug 2019 with lateral lobe hypertrophy. PVR was 31 ml Nov 2019. AUASS = 7. Noc x 3. On tamsulosin increased to 0.8.   Underwent Urolift x 5 Mar 05, 2018. PVR 40. He stopped tamsulosin. AUASS 9 - 12 off the meds. Mainly nocturia and frequency. He voids frequently as the toilet is close. He has turn key when he "gets to the driveway". Bladder scan 121 ml. He takes 3 - 4 tamsulosin on occasion to open things up but doesn't need all the time.   He returns and is back on tamsulosin. Weak stream if he misses a dose. AUASS = 16. He was tx for e coli UTI. No dysuria today. UA clear. Congestion/stuffy nose.     ALLERGIES: Bee stings Cipro - Dizziness Penicillin - Skin Rash sulfa - Dizziness    MEDICATIONS: Hydrochlorothiazide 12.5 mg capsule  Januvia  Pantoprazole Sodium 20 mg tablet, delayed release  Valsartan 160 mg tablet     GU PSH: Complex cystometrogram, w/  void pressure and urethral pressure profile studies, any technique - 11/16/2017 Complex Uroflow - 11/16/2017 Cystoscopy - 11/22/2017 Emg surf Electrd - 11/16/2017 Inject For cystogram - 11/16/2017 Insert Bladder Cath; Complex - 11/13/2017 Intrabd voidng Press - 11/16/2017 UROLIFT - 03/05/2018 UROLIFT - 03/05/2018     NON-GU PSH: None   GU PMH: BPH w/LUTS - 09/20/2019, Now back on tams with a slow stream. He'll return for cystoscopy. , - 08/26/2019, - 11/21/2018, - 10/18/2018, - 2020, - 03/05/2018 (Stable), - 11/22/2017, - 10/31/2017 Urinary Frequency - 2020 Weak Urinary Stream - 03/05/2018 Dysuria - 02/20/2018 Urinary Retention - 11/24/2017, - 11/22/2017, - 11/13/2017, - 11/06/2017, - 10/31/2017 Acute Cystitis/UTI - 10/31/2017    NON-GU PMH: Diabetes Type 2 GERD Hypercholesterolemia Hypertension    FAMILY HISTORY: 2 sons - Son   SOCIAL HISTORY: Marital Status: Married Preferred Language: English; Ethnicity: Not Hispanic Or Latino; Race: White Current Smoking Status: Patient has never smoked.   Tobacco Use Assessment Completed: Used Tobacco in last 30 days? Does drink.     REVIEW OF SYSTEMS:    GU Review Male:   Patient reports frequent urination and get up at night to urinate. Patient denies hard to postpone urination, burning/ pain with urination, leakage of urine, stream starts and stops, trouble starting your stream, have to strain to urinate , erection problems, and penile pain.  Gastrointestinal (Upper):   Patient denies nausea, vomiting, and indigestion/ heartburn.  Gastrointestinal (Lower):  Patient denies diarrhea and constipation.  Constitutional:   Patient denies fever, night sweats, weight loss, and fatigue.  Skin:   Patient denies skin rash/ lesion and itching.  Eyes:   Patient denies blurred vision and double vision.  Ears/ Nose/ Throat:   Patient denies sore throat and sinus problems.  Hematologic/Lymphatic:   Patient denies swollen glands and easy bruising.  Cardiovascular:    Patient denies leg swelling and chest pains.  Respiratory:   Patient denies cough and shortness of breath.  Endocrine:   Patient denies excessive thirst.  Musculoskeletal:   Patient denies back pain and joint pain.  Neurological:   Patient denies headaches and dizziness.  Psychologic:   Patient denies depression and anxiety.   VITAL SIGNS:      10/24/2019 01:41 PM  BP 160/67 mmHg  Pulse 47 /min  Temperature 97.7 F / 36.5 C   GU PHYSICAL EXAMINATION:    Scrotum: No lesions. No edema. No cysts. No warts.  Urethral Meatus: Normal size. No lesion, no wart, no discharge, no polyp. Normal location.  Penis: Circumcised, no warts, no cracks. No dorsal Peyronie's plaques, no left corporal Peyronie's plaques, no right corporal Peyronie's plaques, no scarring, no warts. No balanitis, no meatal stenosis.   MULTI-SYSTEM PHYSICAL EXAMINATION:    Constitutional: Well-nourished. No physical deformities. Normally developed. Good grooming.  Neck: Neck symmetrical, not swollen. Normal tracheal position.  Respiratory: No labored breathing, no use of accessory muscles.   Cardiovascular: Normal temperature, normal extremity pulses, no swelling, no varicosities.  Skin: No paleness, no jaundice, no cyanosis. No lesion, no ulcer, no rash.  Neurologic / Psychiatric: Oriented to time, oriented to place, oriented to person. No depression, no anxiety, no agitation.  Gastrointestinal: No mass, no tenderness, no rigidity, non obese abdomen.     Complexity of Data:   12/20/05  PSA  Total PSA 1.79     PROCEDURES:         Flexible Cystoscopy - 52000  Risks, benefits, and some of the potential complications of the procedure were discussed with the patient. All questions were answered. Informed consent was obtained. Antibiotic prophylaxis was given -- Cipro (he had dizziness on cipro but it may be the tams on retrospect). Sterile technique and intraurethral analgesia were used.  Meatus:  Normal size. Normal  location. Normal condition.  Urethra:  No strictures.  External Sphincter:  Normal.  Verumontanum:  Normal.  Prostate:  Obstructing. Partially resected/treated prostate fossa, but one of the Urolifts has pulled out and is flopping in the urethra. No hyperplasia.  Bladder Neck:  Non-obstructing.  Ureteral Orifices:  Normal location. Normal size. Normal shape. Effluxed clear urine.  Bladder:  Moderate trabeculation. No tumors. Normal mucosa. No stones.      The lower urinary tract was carefully examined. The procedure was well-tolerated and without complications. Antibiotic instructions were given. Instructions were given to call the office immediately for bloody urine, difficulty urinating, painful urination, fever, chills, nausea, vomiting or other illness. The patient stated that he understood these instructions and would comply with them.         Urinalysis Dipstick Dipstick Cont'd  Color: Yellow Bilirubin: Neg mg/dL  Appearance: Clear Ketones: Neg mg/dL  Specific Gravity: 3.790 Blood: Neg ery/uL  pH: <=5.0 Protein: Neg mg/dL  Glucose: Neg mg/dL Urobilinogen: 0.2 mg/dL    Nitrites: Neg    Leukocyte Esterase: Neg leu/uL    ASSESSMENT:      ICD-10 Details  1 GU:   BPH w/LUTS - N40.1  Chronic, Stable  2   Weak Urinary Stream - R39.12 Chronic, Worsening - He'd like to get off tams and we disc the nature r/b/a to cysto, removal of the tab and replacement of new Urolifts and he elects to proceed.    PLAN:            Medications New Meds: Alfuzosin Hcl Er 10 mg tablet, extended release 24 hr 1 tablet PO Q HS   #30  3 Refill(s)            Schedule Return Visit/Planned Activity: Next Available Appointment - Schedule Surgery          Document Letter(s):  Created for Patient: Clinical Summary         Notes:   cc: Dr. Doyne Keel     * Signed by Jerilee Field, M.D. on 10/25/19 at 9:25 AM (EDT)*     The information contained in this medical record document is considered private  and confidential patient information. This information can only be used for the medical diagnosis and/or medical services that are being provided by the patient's selected caregivers. This information can only be distributed outside of the patient's care if the patient agrees and signs waivers of authorization for this information to be sent to an outside source or route.

## 2019-11-08 ENCOUNTER — Encounter (HOSPITAL_BASED_OUTPATIENT_CLINIC_OR_DEPARTMENT_OTHER)
Admission: RE | Disposition: A | Discharge status: Home / Self Care | Payer: Self-pay | Source: Home / Self Care | Attending: Urology

## 2019-11-08 ENCOUNTER — Ambulatory Visit (HOSPITAL_BASED_OUTPATIENT_CLINIC_OR_DEPARTMENT_OTHER): Payer: Medicare Other | Admitting: Anesthesiology

## 2019-11-08 ENCOUNTER — Ambulatory Visit (HOSPITAL_BASED_OUTPATIENT_CLINIC_OR_DEPARTMENT_OTHER)
Admission: RE | Admit: 2019-11-08 | Discharge: 2019-11-08 | Disposition: A | Discharge status: Home / Self Care | Payer: Medicare Other | Attending: Urology | Admitting: Urology

## 2019-11-08 ENCOUNTER — Encounter (HOSPITAL_BASED_OUTPATIENT_CLINIC_OR_DEPARTMENT_OTHER): Payer: Self-pay | Admitting: Urology

## 2019-11-08 DIAGNOSIS — M1990 Unspecified osteoarthritis, unspecified site: Secondary | ICD-10-CM | POA: Diagnosis not present

## 2019-11-08 DIAGNOSIS — R350 Frequency of micturition: Secondary | ICD-10-CM | POA: Diagnosis not present

## 2019-11-08 DIAGNOSIS — Z880 Allergy status to penicillin: Secondary | ICD-10-CM | POA: Insufficient documentation

## 2019-11-08 DIAGNOSIS — K449 Diaphragmatic hernia without obstruction or gangrene: Secondary | ICD-10-CM | POA: Diagnosis not present

## 2019-11-08 DIAGNOSIS — Z881 Allergy status to other antibiotic agents status: Secondary | ICD-10-CM | POA: Diagnosis not present

## 2019-11-08 DIAGNOSIS — R339 Retention of urine, unspecified: Secondary | ICD-10-CM | POA: Diagnosis not present

## 2019-11-08 DIAGNOSIS — E7800 Pure hypercholesterolemia, unspecified: Secondary | ICD-10-CM | POA: Insufficient documentation

## 2019-11-08 DIAGNOSIS — Z91030 Bee allergy status: Secondary | ICD-10-CM | POA: Diagnosis not present

## 2019-11-08 DIAGNOSIS — N401 Enlarged prostate with lower urinary tract symptoms: Secondary | ICD-10-CM | POA: Insufficient documentation

## 2019-11-08 DIAGNOSIS — R3912 Poor urinary stream: Secondary | ICD-10-CM | POA: Diagnosis not present

## 2019-11-08 DIAGNOSIS — Z7984 Long term (current) use of oral hypoglycemic drugs: Secondary | ICD-10-CM | POA: Diagnosis not present

## 2019-11-08 DIAGNOSIS — N138 Other obstructive and reflux uropathy: Secondary | ICD-10-CM | POA: Insufficient documentation

## 2019-11-08 DIAGNOSIS — K219 Gastro-esophageal reflux disease without esophagitis: Secondary | ICD-10-CM | POA: Diagnosis not present

## 2019-11-08 DIAGNOSIS — I10 Essential (primary) hypertension: Secondary | ICD-10-CM | POA: Diagnosis not present

## 2019-11-08 DIAGNOSIS — Z882 Allergy status to sulfonamides status: Secondary | ICD-10-CM | POA: Insufficient documentation

## 2019-11-08 DIAGNOSIS — E119 Type 2 diabetes mellitus without complications: Secondary | ICD-10-CM | POA: Diagnosis not present

## 2019-11-08 DIAGNOSIS — Z79899 Other long term (current) drug therapy: Secondary | ICD-10-CM | POA: Diagnosis not present

## 2019-11-08 HISTORY — DX: Type 2 diabetes mellitus without complications: E11.9

## 2019-11-08 HISTORY — DX: Diverticulosis of large intestine without perforation or abscess without bleeding: K57.30

## 2019-11-08 HISTORY — DX: Presence of external hearing-aid: Z97.4

## 2019-11-08 HISTORY — PX: CYSTOSCOPY WITH INSERTION OF UROLIFT: SHX6678

## 2019-11-08 HISTORY — DX: Palpitations: R00.2

## 2019-11-08 HISTORY — DX: Diaphragmatic hernia without obstruction or gangrene: K44.9

## 2019-11-08 HISTORY — DX: Benign prostatic hyperplasia with lower urinary tract symptoms: N40.1

## 2019-11-08 HISTORY — DX: Essential (primary) hypertension: I10

## 2019-11-08 LAB — POCT I-STAT, CHEM 8
BUN: 22 mg/dL (ref 8–23)
Calcium, Ion: 1.22 mmol/L (ref 1.15–1.40)
Chloride: 103 mmol/L (ref 98–111)
Creatinine, Ser: 0.9 mg/dL (ref 0.61–1.24)
Glucose, Bld: 171 mg/dL — ABNORMAL HIGH (ref 70–99)
HCT: 44 % (ref 39.0–52.0)
Hemoglobin: 15 g/dL (ref 13.0–17.0)
Potassium: 4.1 mmol/L (ref 3.5–5.1)
Sodium: 141 mmol/L (ref 135–145)
TCO2: 23 mmol/L (ref 22–32)

## 2019-11-08 LAB — GLUCOSE, CAPILLARY: Glucose-Capillary: 186 mg/dL — ABNORMAL HIGH (ref 70–99)

## 2019-11-08 SURGERY — CYSTOSCOPY WITH INSERTION OF UROLIFT
Anesthesia: General | Site: Prostate

## 2019-11-08 MED ORDER — OXYCODONE HCL 5 MG PO TABS: 5.0000 mg | ORAL_TABLET | Freq: Once | ORAL | Status: DC | PRN

## 2019-11-08 MED ORDER — LIDOCAINE 2% (20 MG/ML) 5 ML SYRINGE
INTRAMUSCULAR | Status: AC
  Filled 2019-11-08: qty 5

## 2019-11-08 MED ORDER — FENTANYL CITRATE (PF) 100 MCG/2ML IJ SOLN
INTRAMUSCULAR | Status: DC | PRN
  Administered 2019-11-08: 50 ug via INTRAVENOUS
  Administered 2019-11-08 (×2): 25 ug via INTRAVENOUS

## 2019-11-08 MED ORDER — EPHEDRINE 5 MG/ML INJ
INTRAVENOUS | Status: AC
  Filled 2019-11-08: qty 10

## 2019-11-08 MED ORDER — DOXYCYCLINE MONOHYDRATE 100 MG PO TABS: 100.0000 mg | ORAL_TABLET | Freq: Two times a day (BID) | ORAL | 0 refills | Status: DC

## 2019-11-08 MED ORDER — WHITE PETROLATUM EX OINT
TOPICAL_OINTMENT | CUTANEOUS | Status: AC
  Filled 2019-11-08: qty 5

## 2019-11-08 MED ORDER — OXYCODONE HCL 5 MG/5ML PO SOLN: 5.0000 mg | Freq: Once | ORAL | Status: DC | PRN

## 2019-11-08 MED ORDER — STERILE WATER FOR IRRIGATION IR SOLN
Status: DC | PRN
  Administered 2019-11-08: 6000 mL

## 2019-11-08 MED ORDER — PROPOFOL 10 MG/ML IV BOLUS
INTRAVENOUS | Status: AC
  Filled 2019-11-08: qty 20

## 2019-11-08 MED ORDER — FENTANYL CITRATE (PF) 100 MCG/2ML IJ SOLN
INTRAMUSCULAR | Status: AC
  Filled 2019-11-08: qty 2

## 2019-11-08 MED ORDER — LIDOCAINE HCL URETHRAL/MUCOSAL 2 % EX GEL
CUTANEOUS | Status: DC | PRN
  Administered 2019-11-08: 1

## 2019-11-08 MED ORDER — ONDANSETRON HCL 4 MG/2ML IJ SOLN
INTRAMUSCULAR | Status: AC
  Filled 2019-11-08: qty 2

## 2019-11-08 MED ORDER — PROMETHAZINE HCL 25 MG/ML IJ SOLN: 6.2500 mg | INTRAMUSCULAR | Status: DC | PRN

## 2019-11-08 MED ORDER — EPHEDRINE SULFATE-NACL 50-0.9 MG/10ML-% IV SOSY
PREFILLED_SYRINGE | INTRAVENOUS | Status: DC | PRN
  Administered 2019-11-08 (×2): 10 mg via INTRAVENOUS

## 2019-11-08 MED ORDER — VANCOMYCIN HCL IN DEXTROSE 1-5 GM/200ML-% IV SOLN
INTRAVENOUS | Status: AC
  Filled 2019-11-08: qty 200

## 2019-11-08 MED ORDER — FENTANYL CITRATE (PF) 100 MCG/2ML IJ SOLN: 25.0000 ug | INTRAMUSCULAR | Status: DC | PRN

## 2019-11-08 MED ORDER — LACTATED RINGERS IV SOLN: INTRAVENOUS | Status: DC

## 2019-11-08 MED ORDER — ACETAMINOPHEN 500 MG PO TABS
1000.0000 mg | ORAL_TABLET | Freq: Once | ORAL | Status: AC
  Administered 2019-11-08: 1000 mg via ORAL

## 2019-11-08 MED ORDER — ONDANSETRON HCL 4 MG/2ML IJ SOLN
INTRAMUSCULAR | Status: DC | PRN
  Administered 2019-11-08: 4 mg via INTRAVENOUS

## 2019-11-08 MED ORDER — LIDOCAINE 2% (20 MG/ML) 5 ML SYRINGE
INTRAMUSCULAR | Status: DC | PRN
  Administered 2019-11-08: 100 mg via INTRAVENOUS

## 2019-11-08 MED ORDER — PROPOFOL 10 MG/ML IV BOLUS
INTRAVENOUS | Status: DC | PRN
  Administered 2019-11-08: 30 mg via INTRAVENOUS
  Administered 2019-11-08: 170 mg via INTRAVENOUS

## 2019-11-08 MED ORDER — GENTAMICIN SULFATE 40 MG/ML IJ SOLN
5.0000 mg/kg | Freq: Once | INTRAVENOUS | Status: AC
  Administered 2019-11-08: 420 mg via INTRAVENOUS
  Filled 2019-11-08: qty 10.5

## 2019-11-08 MED ORDER — VANCOMYCIN HCL IN DEXTROSE 1-5 GM/200ML-% IV SOLN
1000.0000 mg | Freq: Once | INTRAVENOUS | Status: AC
  Administered 2019-11-08: 1000 mg via INTRAVENOUS

## 2019-11-08 MED ORDER — ACETAMINOPHEN 500 MG PO TABS
ORAL_TABLET | ORAL | Status: AC
  Filled 2019-11-08: qty 2

## 2019-11-08 SURGICAL SUPPLY — 27 items
BAG DRAIN URO-CYSTO SKYTR STRL (DRAIN) ×3 IMPLANT
BAG DRN RND TRDRP ANRFLXCHMBR (UROLOGICAL SUPPLIES) ×1
BAG DRN UROCATH (DRAIN) ×1
BAG URINE DRAIN 2000ML AR STRL (UROLOGICAL SUPPLIES) ×2 IMPLANT
BAG URINE LEG 500ML (DRAIN) IMPLANT
CATH COUDE FOLEY 2W 5CC 18FR (CATHETERS) ×2 IMPLANT
CATH FOLEY 2WAY SLVR  5CC 16FR (CATHETERS)
CATH FOLEY 2WAY SLVR  5CC 18FR (CATHETERS)
CATH FOLEY 2WAY SLVR 5CC 16FR (CATHETERS) IMPLANT
CATH FOLEY 2WAY SLVR 5CC 18FR (CATHETERS) IMPLANT
CLOTH BEACON ORANGE TIMEOUT ST (SAFETY) ×3 IMPLANT
ELECT REM PT RETURN 9FT ADLT (ELECTROSURGICAL)
ELECTRODE REM PT RTRN 9FT ADLT (ELECTROSURGICAL) ×1 IMPLANT
GLOVE BIO SURGEON STRL SZ7.5 (GLOVE) ×3 IMPLANT
GLOVE BIO SURGEON STRL SZ8 (GLOVE) IMPLANT
GOWN STRL REUS W/ TWL XL LVL3 (GOWN DISPOSABLE) ×1 IMPLANT
GOWN STRL REUS W/TWL XL LVL3 (GOWN DISPOSABLE) ×6
HOLDER FOLEY CATH W/STRAP (MISCELLANEOUS) ×2 IMPLANT
KIT TURNOVER CYSTO (KITS) ×3 IMPLANT
MANIFOLD NEPTUNE II (INSTRUMENTS) ×3 IMPLANT
NEEDLE HYPO 22GX1.5 SAFETY (NEEDLE) IMPLANT
NS IRRIG 500ML POUR BTL (IV SOLUTION) IMPLANT
PACK CYSTO (CUSTOM PROCEDURE TRAY) ×3 IMPLANT
SYSTEM UROLIFT (Male Continence) ×8 IMPLANT
TUBE CONNECTING 12'X1/4 (SUCTIONS) ×1
TUBE CONNECTING 12X1/4 (SUCTIONS) ×1 IMPLANT
WATER STERILE IRR 3000ML UROMA (IV SOLUTION) ×5 IMPLANT

## 2019-11-08 NOTE — Anesthesia Postprocedure Evaluation (Signed)
Anesthesia Post Note  Patient: Gabriel Mcpherson  Procedure(s) Performed: CYSTOSCOPY WITH INSERTION OF UROLIFT (N/A Prostate)     Patient location during evaluation: PACU Anesthesia Type: General Level of consciousness: awake and alert and oriented Pain management: pain level controlled Vital Signs Assessment: post-procedure vital signs reviewed and stable Respiratory status: spontaneous breathing, nonlabored ventilation and respiratory function stable Cardiovascular status: blood pressure returned to baseline Postop Assessment: no apparent nausea or vomiting Anesthetic complications: no   No complications documented.  Last Vitals:  Vitals:   11/08/19 1300 11/08/19 1310  BP: (!) 144/67 (!) 133/74  Pulse: 69 68  Resp: 20 22  Temp:    SpO2: 96% 95%    Last Pain:  Vitals:   11/08/19 1230  TempSrc:   PainSc: Asleep                 Kaylyn Layer

## 2019-11-08 NOTE — Transfer of Care (Signed)
Immediate Anesthesia Transfer of Care Note  Patient: Gabriel Mcpherson  Procedure(s) Performed: Procedure(s) (LRB): CYSTOSCOPY WITH INSERTION OF UROLIFT (N/A)  Patient Location: PACU  Anesthesia Type: General  Level of Consciousness: awake, oriented, sedated and patient cooperative  Airway & Oxygen Therapy: Patient Spontanous Breathing and Patient connected to face mask oxygen  Post-op Assessment: Report given to PACU RN and Post -op Vital signs reviewed and stable  Post vital signs: Reviewed and stable  Complications: No apparent anesthesia complications Last Vitals:  Vitals Value Taken Time  BP 146/69 11/08/19 1230  Temp 36.3 C 11/08/19 1230  Pulse 63 11/08/19 1234  Resp 14 11/08/19 1234  SpO2 98 % 11/08/19 1234  Vitals shown include unvalidated device data.  Last Pain:  Vitals:   11/08/19 0953  TempSrc: Oral      Patients Stated Pain Goal: 4 (11/08/19 0953)  Complications: No complications documented.

## 2019-11-08 NOTE — Anesthesia Procedure Notes (Signed)
Procedure Name: LMA Insertion Date/Time: 11/08/2019 11:45 AM Performed by: Francie Massing, CRNA Pre-anesthesia Checklist: Patient identified, Emergency Drugs available, Suction available and Patient being monitored Patient Re-evaluated:Patient Re-evaluated prior to induction Oxygen Delivery Method: Circle system utilized Preoxygenation: Pre-oxygenation with 100% oxygen Induction Type: IV induction Ventilation: Mask ventilation without difficulty LMA: LMA inserted LMA Size: 5.0 Number of attempts: 3 Airway Equipment and Method: Bite block Placement Confirmation: positive ETCO2 Tube secured with: Tape Dental Injury: Teeth and Oropharynx as per pre-operative assessment  Comments: Attempted LMA insertion #4 - big air leak.  #5 LMA insertion successful with good air seal.

## 2019-11-08 NOTE — Op Note (Signed)
Preoperative diagnosis: BPH with obstructive symptomatology. Postoperative diagnosis: Same  Principal procedure: Urolift procedure, with the placement of 4 implants.  Surgeon: Mena Goes  Anesthesia: Gen  Complications: None  Drains: 18 French Foley catheter, to leg bag.  Estimated blood loss: Less than 25 mL  Indications: 79 -year-old male with obstructive symptomatology secondary to BPH. The patient's symptoms have progressed, and he has requested further management. Management options including TURP with resection/ablation of the prostate as well as repeat Urolift were discussed. The patient has chosen to have additional Urolift procedure. He has been instructed to the procedure as well as risks and complications which include but are not limited to infection, bleeding, and inadequate treatment with the Urolift procedure alone, anesthetic complications, among others. He understands these and desires to proceed.  Findings: Using the 17 French cystoscope, urethra and bladder were inspected. There were no urethral lesions. Prostatic urethra was obstructed secondary to bilobar hypertrophy primarily on the right and left apical. Left mid and proximal channel component was patent. A right mid-apical implant was loose and fell out/washed out with the scope. The bladder was inspected circumferentially. This revealed normal findings. Moderate trabeculation.   Description of procedure: The patient was properly identified in the holding area. He received preoperative antibiotics. He was taken to the OR and after adequate anesthesia he was placed in the dorsolithotomy position. Genitalia and perineum were prepped and draped. Proper timeout was performed. A 81F cystoscope was inserted into the bladder. The cystoscopy bridge was replaced with a UroLift delivery device.The first treatment site was the patient's right side approximately 1.5cm distal to the bladder neck. The distal tip of the delivery device was  then angled laterally approximately 20 degrees at this position to compress the lateral lobe. The trigger was pulled, thereby deploying a needle containing the implant through the prostate. The needle was then retracted, allowing one end of the implant to be delivered to the capsular surface of the prostate. The implant was then tensioned to assure capsular seating and removal of slack monofilament. The device was then angled back toward midline and slowly advanced proximally until cystoscopic verification of the monofilament being centered in the delivery bay. The urethral end piece was then affixed to the monofilament thereby tailoring the size of the implant. Excess filament was then severed. The delivery device was then re-advanced into the bladder. The delivery device was then replaced with cystoscope and bridge and the implant location and opening effect was confirmed cystoscopically. The same procedure was then repeated on the right mid (2) and right apical (3). One additional implant (4) was delivered just proximal to the verumontanum on the left side to match the right apical following the same technique. A final cystoscopy was conducted first to inspect the location and state of each implant and second, to confirm the presence of a continuous anterior channel was present through the prostatic urethra with irrigation flow turned off. Four implants were delivered in total. Following this, the scope was removed and an 67 French Foley catheter was placed and hooked to dependent drainage. He was then awakened and taken to the recovery area in stable condition. He tolerated the procedure well.

## 2019-11-08 NOTE — Anesthesia Preprocedure Evaluation (Addendum)
Anesthesia Evaluation  Patient identified by MRN, date of birth, ID band Patient awake    Reviewed: Allergy & Precautions, NPO status , Patient's Chart, lab work & pertinent test results  History of Anesthesia Complications Negative for: history of anesthetic complications  Airway Mallampati: II  TM Distance: >3 FB Neck ROM: Full    Dental no notable dental hx.    Pulmonary neg pulmonary ROS,    Pulmonary exam normal        Cardiovascular hypertension, Pt. on medications Normal cardiovascular exam     Neuro/Psych negative neurological ROS  negative psych ROS   GI/Hepatic Neg liver ROS, hiatal hernia, GERD  Medicated and Controlled,  Endo/Other  diabetes, Type 2, Oral Hypoglycemic Agents  Renal/GU negative Renal ROS   BPH    Musculoskeletal  (+) Arthritis ,   Abdominal   Peds  Hematology negative hematology ROS (+)   Anesthesia Other Findings Day of surgery medications reviewed with patient.  Reproductive/Obstetrics negative OB ROS                            Anesthesia Physical Anesthesia Plan  ASA: II  Anesthesia Plan: General   Post-op Pain Management:    Induction: Intravenous  PONV Risk Score and Plan: 2 and Treatment may vary due to age or medical condition, Ondansetron and Dexamethasone  Airway Management Planned: LMA  Additional Equipment: None  Intra-op Plan:   Post-operative Plan: Extubation in OR  Informed Consent: I have reviewed the patients History and Physical, chart, labs and discussed the procedure including the risks, benefits and alternatives for the proposed anesthesia with the patient or authorized representative who has indicated his/her understanding and acceptance.     Dental advisory given  Plan Discussed with: CRNA  Anesthesia Plan Comments:        Anesthesia Quick Evaluation

## 2019-11-08 NOTE — Interval H&P Note (Signed)
History and Physical Interval Note:  11/08/2019 11:18 AM  Gabriel Mcpherson  has presented today for surgery, with the diagnosis of BENIGN PROSTATIC HYPERPLASIA.  The various methods of treatment have been discussed with the patient and family. After consideration of risks, benefits and other options for treatment, the patient has consented to  Procedure(s): CYSTOSCOPY WITH INSERTION OF UROLIFT (N/A) as a surgical intervention.  The patient's history has been reviewed, patient examined, no change in status, stable for surgery.  I have reviewed the patient's chart and labs.  Questions were answered to the patient's satisfaction.  He is well with no dysuria or hematuria. No fever. Disc possible catheter.    Jerilee Field

## 2019-11-08 NOTE — Discharge Instructions (Signed)
Prostatic Urethral Lift, Care After This sheet gives you information about how to care for yourself after your procedure. Your health care provider may also give you more specific instructions. If you have problems or questions, contact your health care provider. What can I expect after the procedure? After the procedure, it is common to have:  Discomfort or burning when urinating.  An increased urge to urinate.  More frequent urination.  Urine that is slightly blood-tinged. These symptoms should go away after a few days. Follow these instructions at home:   Take over-the-counter and prescription medicines only as told by your health care provider.  Do not drive for 24 hours if you were given a medicine to help you relax (sedative).  Do not drive or use heavy machinery while taking prescription pain medicine.  Do not lift anything that is heavier than 10 lb (4.5 kg) until your health care provider says that this is safe.  Return to your normal activities as told by your health care provider. Ask your health care provider what activities are safe for you. Ask when you can return to sexual activity.  Drink enough fluid to keep your urine clear or pale yellow.  Keep all follow-up visits as told by your health care provider. This is important. Contact a health care provider if:  You have chills or a fever.  You have pain when passing urine.  You have bright red blood or blood clots in your urine.  You have difficulty passing urine.  You have leaking of urine (incontinence). Get help right away if:  You have chest pain or shortness of breath.  You have leg pain or swelling.  You cannot pass urine. Summary  After the procedure, it is common to have discomfort or burning when urinating, an increased urge to urinate, more frequent urination, and urine that is slightly blood-tinged.  Do not drive for 24 hours if you were given a medicine to help you relax (sedative). Do not  drive or use heavy machinery while taking prescription pain medicine.  Do not lift anything that is heavier than 10 lb (4.5 kg) until your health care provider says that this is safe.  Return to your normal activities as told by your health care provider. This information is not intended to replace advice given to you by your health care provider. Make sure you discuss any questions you have with your health care provider. Document Revised: 03/10/2017 Document Reviewed: 05/17/2016 Elsevier Patient Education  2020 [employer].  Post Anesthesia Home Care Instructions  Activity: Get plenty of rest for the remainder of the day. A responsible individual must stay with you for 24 hours following the procedure.  For the next 24 hours, DO NOT: -Drive a car -Advertising copywriter -Drink alcoholic beverages -Take any medication unless instructed by your physician -Make any legal decisions or sign important papers.  Meals: Start with liquid foods such as gelatin or soup. Progress to regular foods as tolerated. Avoid greasy, spicy, heavy foods. If nausea and/or vomiting occur, drink only clear liquids until the nausea and/or vomiting subsides. Call your physician if vomiting continues.  Special Instructions/Symptoms: Your throat may feel dry or sore from the anesthesia or the breathing tube placed in your throat during surgery. If this causes discomfort, gargle with warm salt water. The discomfort should disappear within 24 hours.  NO ADDITIONAL TYLENOL OR ACETAMINOPHEN UNTIL AFTER 4:00 PM TODAY IF NEEDED.

## 2019-11-11 ENCOUNTER — Encounter (HOSPITAL_BASED_OUTPATIENT_CLINIC_OR_DEPARTMENT_OTHER): Payer: Self-pay | Admitting: Urology

## 2020-01-14 NOTE — Patient Instructions (Addendum)
DUE TO COVID-19 ONLY ONE VISITOR IS ALLOWED TO COME WITH YOU AND STAY IN THE WAITING ROOM ONLY DURING PRE OP AND PROCEDURE DAY OF SURGERY. THE 1 VISITOR  MAY VISIT WITH YOU AFTER SURGERY IN YOUR PRIVATE ROOM DURING VISITING HOURS ONLY!  YOU NEED TO HAVE A COVID 19 TEST ON 01-20-2020 @_______ , THIS TEST MUST BE DONE BEFORE SURGERY,  COVID TESTING SITE [address] [place] , IT IS ON THE RIGHT GOING OUT [address] APPROXIMATELY  2 MINUTES PAST [employer] ON THE RIGHT. ONCE YOUR COVID TEST IS COMPLETED,  PLEASE BEGIN THE QUARANTINE INSTRUCTIONS AS OUTLINED IN YOUR HANDOUT.                [id number]    Your procedure is scheduled on: 01-23-2020   Report to [facility] Main  Entrance   Report to admitting at 730  AM     Call this number if you have problems the morning of surgery [phone]    REMEMBER: NO  SOLID FOOD CANDY OR GUM AFTER MIDNIGHT. CLEAR LIQUIDS UNTIL 700 AM   . NOTHING BY MOUTH EXCEPT CLEAR LIQUIDS UNTIL 700 AM   . PLEASE FINISH G2 DRINK PER SURGEON ORDER  WHICH NEEDS TO BE COMPLETED AT     700 AM .      CLEAR LIQUID DIET   Foods Allowed                                                                    Coffee and tea, regular and decaf                            Fruit ices (not with fruit pulp)                                      Iced Popsicles                                    Carbonated beverages, regular and diet                                    Cranberry, grape and apple juices Sports drinks like Gatorade Lightly seasoned clear broth or consume(fat free) Sugar, honey syrup ___________________________________________________________________      BRUSH YOUR TEETH MORNING OF SURGERY AND RINSE YOUR MOUTH OUT, NO CHEWING GUM CANDY OR MINTS.     Take these medicines the morning of surgery with A SIP OF WATER: PROTONIX  DO NOT TAKE ANY DIABETIC MEDICATIONS DAY OF YOUR SURGERY                               You  may not have any metal on your body including hair pins and              piercings  Do not wear jewelry, make-up, lotions, powders or perfumes, deodorant  Do not wear nail polish on your fingernails.  Do not shave  48 hours prior to surgery.              Men may shave face and neck.   Do not bring valuables to the hospital. [facility] IS NOT             RESPONSIBLE   FOR VALUABLES.  Contacts, dentures or bridgework may not be worn into surgery.  Leave suitcase in the car. After surgery it may be brought to your room.     Patients discharged the day of surgery will not be allowed to drive home. IF YOU ARE HAVING SURGERY AND GOING HOME THE SAME DAY, YOU MUST HAVE AN ADULT TO DRIVE YOU HOME AND BE WITH YOU FOR 24 HOURS. YOU MAY GO HOME BY TAXI OR UBER OR ORTHERWISE, BUT AN ADULT MUST ACCOMPANY YOU HOME AND STAY WITH YOU FOR 24 HOURS.  Name and phone number of your driver:  Special Instructions: N/A              Please read over the following fact sheets you were given: _____________________________________________________________________  [facility] - Preparing for Surgery Before surgery, you can play an important role.  Because skin is not sterile, your skin needs to be as free of germs as possible.  You can reduce the number of germs on your skin by washing with CHG (chlorahexidine gluconate) soap before surgery.  CHG is an antiseptic cleaner which kills germs and bonds with the skin to continue killing germs even after washing. Please DO NOT use if you have an allergy to CHG or antibacterial soaps.  If your skin becomes reddened/irritated stop using the CHG and inform your nurse when you arrive at Short Stay. Do not shave (including legs and underarms) for at least 48 hours prior to the first CHG shower.  You may shave your face/neck. Please follow these instructions carefully:  1.  Shower with CHG Soap the night before surgery and the  morning of Surgery.  2.  If you choose to  wash your hair, wash your hair first as usual with your  normal  shampoo.  3.  After you shampoo, rinse your hair and body thoroughly to remove the  shampoo.                           4.  Use CHG as you would any other liquid soap.  You can apply chg directly  to the skin and wash                       Gently with a scrungie or clean washcloth.  5.  Apply the CHG Soap to your body ONLY FROM THE NECK DOWN.   Do not use on face/ open                           Wound or open sores. Avoid contact with eyes, ears mouth and genitals (private parts).                       Wash face,  Genitals (private parts) with your normal soap.             6.  Wash thoroughly, paying special attention to the area where your surgery  will be performed.  7.  Thoroughly rinse your body with  warm water from the neck down.  8.  DO NOT shower/wash with your normal soap after using and rinsing off  the CHG Soap.                9.  Pat yourself dry with a clean towel.            10.  Wear clean pajamas.            11.  Place clean sheets on your bed the night of your first shower and do not  sleep with pets. Day of Surgery : Do not apply any lotions/deodorants the morning of surgery.  Please wear clean clothes to the hospital/surgery center.  FAILURE TO FOLLOW THESE INSTRUCTIONS MAY RESULT IN THE CANCELLATION OF YOUR SURGERY PATIENT SIGNATURE_________________________________  NURSE SIGNATURE__________________________________  ________________________________________________________________________             DUE TO COVID-19 ONLY ONE VISITOR IS ALLOWED TO COME WITH YOU AND STAY IN THE WAITING ROOM ONLY DURING PRE OP AND PROCEDURE DAY OF SURGERY. THE 1 VISITOR  MAY VISIT WITH YOU AFTER SURGERY IN YOUR PRIVATE ROOM DURING VISITING HOURS ONLY!  YOU NEED TO HAVE A COVID 19 TEST ON_______ @_______ , THIS TEST MUST BE DONE BEFORE SURGERY,  COVID TESTING SITE [address] [place] [phone], IT IS ON THE RIGHT GOING  OUT [address] APPROXIMATELY  2 MINUTES PAST [employer] ON THE RIGHT. ONCE YOUR COVID TEST IS COMPLETED,  PLEASE BEGIN THE QUARANTINE INSTRUCTIONS AS OUTLINED IN YOUR HANDOUT.                Bonney RousselDonald L Kasel  01/14/2020   Your procedure is scheduled on:    [facility]   Report to admitting at AM     Call this number if you have problems the morning of surgery [phone]    REMEMBER: NO  SOLID FOOD CANDY OR GUM AFTER MIDNIGHT. CLEAR LIQUIDS UNTIL         . NOTHING BY MOUTH EXCEPT CLEAR LIQUIDS UNTIL    . PLEASE FINISH ENSURE DRINK PER SURGEON ORDER  WHICH NEEDS TO BE COMPLETED AT      .      CLEAR LIQUID DIET   Foods Allowed                                                                    Coffee and tea, regular and decaf                            Fruit ices (not with fruit pulp)                                      Iced Popsicles                                    Carbonated beverages, regular and diet  Cranberry, grape and apple juices Sports drinks like Gatorade Lightly seasoned clear broth or consume(fat free) Sugar, honey syrup ___________________________________________________________________      BRUSH YOUR TEETH MORNING OF SURGERY AND RINSE YOUR MOUTH OUT, NO CHEWING GUM CANDY OR MINTS.     Take these medicines the morning of surgery with A SIP OF WATER: PANTAPRAZOLE  DO NOT TAKE ANY DIABETIC MEDICATIONS DAY OF YOUR SURGERY   How to Manage Your Diabetes Before and After Surgery  Why is it important to control my blood sugar before and after surgery? . Improving blood sugar levels before and after surgery helps healing and can limit problems. . A way of improving blood sugar control is eating a healthy diet by: o  Eating less sugar and carbohydrates o  Increasing activity/exercise o  Talking with your doctor about reaching your blood sugar goals . High blood sugars (greater  than 180 mg/dL) can raise your risk of infections and slow your recovery, so you will need to focus on controlling your diabetes during the weeks before surgery. . Make sure that the doctor who takes care of your diabetes knows about your planned surgery including the date and location.  How do I manage my blood sugar before surgery? . Check your blood sugar at least 4 times a day, starting 2 days before surgery, to make sure that the level is not too high or low. o Check your blood sugar the morning of your surgery when you wake up and every 2 hours until you get to the Short Stay unit. . If your blood sugar is less than 70 mg/dL, you will need to treat for low blood sugar: o Do not take insulin. o Treat a low blood sugar (less than 70 mg/dL) with  cup of clear juice (cranberry or apple), 4 glucose tablets, OR glucose gel. o Recheck blood sugar in 15 minutes after treatment (to make sure it is greater than 70 mg/dL). If your blood sugar is not greater than 70 mg/dL on recheck, call [phone] for further instructions. . Report your blood sugar to the short stay nurse when you get to Short Stay.  . If you are admitted to the hospital after surgery: o Your blood sugar will be checked by the staff and you will probably be given insulin after surgery (instead of oral diabetes medicines) to make sure you have good blood sugar levels. o The goal for blood sugar control after surgery is 80-180 mg/dL.   WHAT DO I DO ABOUT MY DIABETES MEDICATION?  Marland Kitchen Take your morning dose of glipizide day before surgery, do not take glipizide morning of surgery . Take your usual dose of metformin day of surgery, do not take metformin day of surgery . Do not take oral diabetes medicines (pills) the morning of surgery.     Reviewed and Endorsed by [facility] Patient Education Committee, August 2015                             You may not have any metal on your body including hair pins and               piercings  Do not wear jewelry, make-up, lotions, powders or perfumes, deodorant             Do not wear nail polish on your fingernails.  Do not shave  48 hours prior to surgery.  Men may shave face and neck.   Do not bring valuables to the hospital. [facility] IS NOT             RESPONSIBLE   FOR VALUABLES.  Contacts, dentures or bridgework may not be worn into surgery.  Leave suitcase in the car. After surgery it may be brought to your room.                 Please read over the following fact sheets you were given: _____________________________________________________________________  [facility] - Preparing for Surgery Before surgery, you can play an important role.  Because skin is not sterile, your skin needs to be as free of germs as possible.  You can reduce the number of germs on your skin by washing with CHG (chlorahexidine gluconate) soap before surgery.  CHG is an antiseptic cleaner which kills germs and bonds with the skin to continue killing germs even after washing. Please DO NOT use if you have an allergy to CHG or antibacterial soaps.  If your skin becomes reddened/irritated stop using the CHG and inform your nurse when you arrive at Short Stay. Do not shave (including legs and underarms) for at least 48 hours prior to the first CHG shower.  You may shave your face/neck. Please follow these instructions carefully:  1.  Shower with CHG Soap the night before surgery and the  morning of Surgery.  2.  If you choose to wash your hair, wash your hair first as usual with your  normal  shampoo.  3.  After you shampoo, rinse your hair and body thoroughly to remove the  shampoo.                           4.  Use CHG as you would any other liquid soap.  You can apply chg directly  to the skin and wash                       Gently with a scrungie or clean washcloth.  5.  Apply the CHG Soap to your body ONLY FROM THE NECK DOWN.   Do not use on face/ open                            Wound or open sores. Avoid contact with eyes, ears mouth and genitals (private parts).                       Wash face,  Genitals (private parts) with your normal soap.             6.  Wash thoroughly, paying special attention to the area where your surgery  will be performed.  7.  Thoroughly rinse your body with warm water from the neck down.  8.  DO NOT shower/wash with your normal soap after using and rinsing off  the CHG Soap.                9.  Pat yourself dry with a clean towel.            10.  Wear clean pajamas.            11.  Place clean sheets on your bed the night of your first shower and do not  sleep with pets. Day of Surgery : Do not apply any lotions/deodorants the morning  of surgery.  Please wear clean clothes to the hospital/surgery center.  FAILURE TO FOLLOW THESE INSTRUCTIONS MAY RESULT IN THE CANCELLATION OF YOUR SURGERY PATIENT SIGNATURE_________________________________  NURSE SIGNATURE__________________________________  ________________________________________________________________________   [facility]- Preparing for Total Shoulder Arthroplasty    Before surgery, you can play an important role. Because skin is not sterile, your skin needs to be as free of germs as possible. You can reduce the number of germs on your skin by using the following products. . Benzoyl Peroxide Gel o Reduces the number of germs present on the skin o Applied twice a day to shoulder area starting two days before surgery    ==================================================================  Please follow these instructions carefully:  BENZOYL PEROXIDE 5% GEL  Please do not use if you have an allergy to benzoyl peroxide.   If your skin becomes reddened/irritated stop using the benzoyl peroxide.  Starting two days before surgery, apply as follows: 1. Apply benzoyl peroxide in the morning and at night. Apply after taking a shower. If you are not taking a shower clean entire  shoulder front, back, and side along with the armpit with a clean wet washcloth.  2. Place a quarter-sized dollop on your shoulder and rub in thoroughly, making sure to cover the front, back, and side of your shoulder, along with the armpit.   2 days before  ON 10-12-2021____ AM   ____ PM          AND     ON 01-22-2020    1 day before ____ AM   ____ PM                         3. Do this twice a day for two days.  (Last application is the night before surgery, AFTER using the CHG soap as described below).  4. Do NOT apply benzoyl peroxide gel on the day of surgery.

## 2020-01-20 ENCOUNTER — Encounter (HOSPITAL_COMMUNITY): Payer: Self-pay

## 2020-01-20 ENCOUNTER — Encounter (HOSPITAL_COMMUNITY)
Admission: RE | Admit: 2020-01-20 | Discharge: 2020-01-20 | Disposition: A | Discharge status: Home / Self Care | Payer: Medicare Other | Source: Ambulatory Visit | Attending: Orthopedic Surgery | Admitting: Orthopedic Surgery

## 2020-01-20 ENCOUNTER — Other Ambulatory Visit (HOSPITAL_COMMUNITY)
Admission: RE | Admit: 2020-01-20 | Discharge: 2020-01-20 | Disposition: A | Discharge status: Home / Self Care | Payer: Medicare Other | Source: Ambulatory Visit | Attending: Orthopedic Surgery | Admitting: Orthopedic Surgery

## 2020-01-20 ENCOUNTER — Other Ambulatory Visit: Payer: Self-pay

## 2020-01-20 DIAGNOSIS — Z01812 Encounter for preprocedural laboratory examination: Secondary | ICD-10-CM | POA: Insufficient documentation

## 2020-01-20 DIAGNOSIS — Z20822 Contact with and (suspected) exposure to covid-19: Secondary | ICD-10-CM | POA: Insufficient documentation

## 2020-01-20 HISTORY — DX: Unspecified osteoarthritis, unspecified site: M19.90

## 2020-01-20 HISTORY — DX: Gastro-esophageal reflux disease without esophagitis: K21.9

## 2020-01-20 LAB — BASIC METABOLIC PANEL
Anion gap: 12 (ref 5–15)
BUN: 24 mg/dL — ABNORMAL HIGH (ref 8–23)
CO2: 25 mmol/L (ref 22–32)
Calcium: 9.8 mg/dL (ref 8.9–10.3)
Chloride: 103 mmol/L (ref 98–111)
Creatinine, Ser: 1.02 mg/dL (ref 0.61–1.24)
GFR, Estimated: >60 mL/min (ref >60)
Glucose, Bld: 125 mg/dL — ABNORMAL HIGH (ref 70–99)
Potassium: 4.5 mmol/L (ref 3.5–5.1)
Sodium: 140 mmol/L (ref 135–145)

## 2020-01-20 LAB — SURGICAL PCR SCREEN
MRSA, PCR: NEGATIVE
Staphylococcus aureus: POSITIVE — AB

## 2020-01-20 LAB — CBC
HCT: 44.6 % (ref 39.0–52.0)
Hemoglobin: 15.1 g/dL (ref 13.0–17.0)
MCH: 32.3 pg (ref 26.0–34.0)
MCHC: 33.9 g/dL (ref 30.0–36.0)
MCV: 95.5 fL (ref 80.0–100.0)
Platelets: 186 10*3/uL (ref 150–400)
RBC: 4.67 MIL/uL (ref 4.22–5.81)
RDW: 13.8 % (ref 11.5–15.5)
WBC: 8.3 10*3/uL (ref 4.0–10.5)
nRBC: 0 % (ref 0.0–0.2)

## 2020-01-20 LAB — SARS CORONAVIRUS 2 (TAT 6-24 HRS): SARS Coronavirus 2: NEGATIVE

## 2020-01-20 LAB — GLUCOSE, CAPILLARY: Glucose-Capillary: 125 mg/dL — ABNORMAL HIGH (ref 70–99)

## 2020-01-20 LAB — HEMOGLOBIN A1C
Hgb A1c MFr Bld: 6.5 % — ABNORMAL HIGH (ref 4.8–5.6)
Mean Plasma Glucose: 139.85 mg/dL

## 2020-01-20 NOTE — Progress Notes (Addendum)
Anesthesia Review:  PCP:  [facility]- DR Banerjee-10/23/2019 on chart  With LOV note on chart   Cardiologist : Chest x-ray : EKG : 10/23/2019 on chart  Echo : 10/30/19 on chart  Stress test: 10/23/2019 on chart  Cardiac Cath :  Activity level: can do a flight of stairs without difficulty  Sleep Study/ CPAP : Fasting Blood Sugar :      / Checks Blood Sugar -- times a day:   Blood Thinner/ Instructions /Last Dose: ASA / Instructions/ Last Dose :  A1C done 01/20/20 - epic

## 2020-01-20 NOTE — Progress Notes (Signed)
[facility]

## 2020-01-23 ENCOUNTER — Ambulatory Visit (HOSPITAL_COMMUNITY)
Admission: RE | Admit: 2020-01-23 | Discharge: 2020-01-24 | Disposition: A | Discharge status: Home / Self Care | Payer: Medicare Other | Attending: Orthopedic Surgery | Admitting: Orthopedic Surgery

## 2020-01-23 ENCOUNTER — Ambulatory Visit (HOSPITAL_COMMUNITY): Payer: Medicare Other | Admitting: Physician Assistant

## 2020-01-23 ENCOUNTER — Encounter (HOSPITAL_COMMUNITY): Payer: Self-pay | Admitting: Orthopedic Surgery

## 2020-01-23 ENCOUNTER — Ambulatory Visit (HOSPITAL_COMMUNITY): Payer: Medicare Other | Admitting: Anesthesiology

## 2020-01-23 ENCOUNTER — Other Ambulatory Visit: Payer: Self-pay

## 2020-01-23 ENCOUNTER — Encounter (HOSPITAL_COMMUNITY)
Admission: RE | Disposition: A | Discharge status: Home / Self Care | Payer: Self-pay | Source: Home / Self Care | Attending: Orthopedic Surgery

## 2020-01-23 DIAGNOSIS — Z96612 Presence of left artificial shoulder joint: Secondary | ICD-10-CM

## 2020-01-23 DIAGNOSIS — E119 Type 2 diabetes mellitus without complications: Secondary | ICD-10-CM | POA: Insufficient documentation

## 2020-01-23 DIAGNOSIS — N401 Enlarged prostate with lower urinary tract symptoms: Secondary | ICD-10-CM | POA: Insufficient documentation

## 2020-01-23 DIAGNOSIS — Z7984 Long term (current) use of oral hypoglycemic drugs: Secondary | ICD-10-CM | POA: Diagnosis not present

## 2020-01-23 DIAGNOSIS — Z881 Allergy status to other antibiotic agents status: Secondary | ICD-10-CM | POA: Diagnosis not present

## 2020-01-23 DIAGNOSIS — Z880 Allergy status to penicillin: Secondary | ICD-10-CM | POA: Insufficient documentation

## 2020-01-23 DIAGNOSIS — M25712 Osteophyte, left shoulder: Secondary | ICD-10-CM | POA: Diagnosis not present

## 2020-01-23 DIAGNOSIS — K449 Diaphragmatic hernia without obstruction or gangrene: Secondary | ICD-10-CM | POA: Insufficient documentation

## 2020-01-23 DIAGNOSIS — K219 Gastro-esophageal reflux disease without esophagitis: Secondary | ICD-10-CM | POA: Insufficient documentation

## 2020-01-23 DIAGNOSIS — M1990 Unspecified osteoarthritis, unspecified site: Secondary | ICD-10-CM | POA: Insufficient documentation

## 2020-01-23 DIAGNOSIS — Z882 Allergy status to sulfonamides status: Secondary | ICD-10-CM | POA: Diagnosis not present

## 2020-01-23 DIAGNOSIS — Z79899 Other long term (current) drug therapy: Secondary | ICD-10-CM | POA: Diagnosis not present

## 2020-01-23 DIAGNOSIS — M19012 Primary osteoarthritis, left shoulder: Secondary | ICD-10-CM | POA: Insufficient documentation

## 2020-01-23 DIAGNOSIS — M24012 Loose body in left shoulder: Secondary | ICD-10-CM | POA: Diagnosis not present

## 2020-01-23 DIAGNOSIS — I10 Essential (primary) hypertension: Secondary | ICD-10-CM | POA: Diagnosis not present

## 2020-01-23 HISTORY — PX: REVERSE SHOULDER ARTHROPLASTY: SHX5054

## 2020-01-23 LAB — GLUCOSE, CAPILLARY
Glucose-Capillary: 163 mg/dL — ABNORMAL HIGH (ref 70–99)
Glucose-Capillary: 189 mg/dL — ABNORMAL HIGH (ref 70–99)
Glucose-Capillary: 311 mg/dL — ABNORMAL HIGH (ref 70–99)

## 2020-01-23 SURGERY — ARTHROPLASTY, SHOULDER, TOTAL, REVERSE
Anesthesia: General | Site: Shoulder | Laterality: Left

## 2020-01-23 MED ORDER — ORAL CARE MOUTH RINSE: 15.0000 mL | Freq: Once | OROMUCOSAL | Status: AC

## 2020-01-23 MED ORDER — PROPOFOL 10 MG/ML IV BOLUS
INTRAVENOUS | Status: DC | PRN
  Administered 2020-01-23: 150 mg via INTRAVENOUS

## 2020-01-23 MED ORDER — LIDOCAINE 2% (20 MG/ML) 5 ML SYRINGE
INTRAMUSCULAR | Status: AC
  Filled 2020-01-23: qty 5

## 2020-01-23 MED ORDER — ROCURONIUM BROMIDE 10 MG/ML (PF) SYRINGE
PREFILLED_SYRINGE | INTRAVENOUS | Status: DC | PRN
  Administered 2020-01-23: 60 mg via INTRAVENOUS

## 2020-01-23 MED ORDER — CEFAZOLIN SODIUM-DEXTROSE 2-4 GM/100ML-% IV SOLN
2.0000 g | INTRAVENOUS | Status: AC
  Administered 2020-01-23: 2 g via INTRAVENOUS
  Filled 2020-01-23: qty 100

## 2020-01-23 MED ORDER — OXYCODONE HCL 5 MG PO TABS: 5.0000 mg | ORAL_TABLET | Freq: Once | ORAL | Status: DC | PRN

## 2020-01-23 MED ORDER — EPHEDRINE SULFATE-NACL 50-0.9 MG/10ML-% IV SOSY
PREFILLED_SYRINGE | INTRAVENOUS | Status: DC | PRN
  Administered 2020-01-23: 10 mg via INTRAVENOUS
  Administered 2020-01-23: 5 mg via INTRAVENOUS

## 2020-01-23 MED ORDER — LIDOCAINE 2% (20 MG/ML) 5 ML SYRINGE
INTRAMUSCULAR | Status: DC | PRN
  Administered 2020-01-23: 50 mg via INTRAVENOUS

## 2020-01-23 MED ORDER — SUGAMMADEX SODIUM 200 MG/2ML IV SOLN
INTRAVENOUS | Status: DC | PRN
  Administered 2020-01-23: 200 mg via INTRAVENOUS

## 2020-01-23 MED ORDER — CHLORHEXIDINE GLUCONATE 0.12 % MT SOLN
15.0000 mL | Freq: Once | OROMUCOSAL | Status: AC
  Administered 2020-01-23: 15 mL via OROMUCOSAL

## 2020-01-23 MED ORDER — HYDROCHLOROTHIAZIDE 25 MG PO TABS: 12.5000 mg | ORAL_TABLET | Freq: Every day | ORAL | Status: DC

## 2020-01-23 MED ORDER — METOCLOPRAMIDE HCL 5 MG/ML IJ SOLN: 5.0000 mg | Freq: Three times a day (TID) | INTRAMUSCULAR | Status: DC | PRN

## 2020-01-23 MED ORDER — ACETAMINOPHEN 325 MG PO TABS: 325.0000 mg | ORAL_TABLET | Freq: Four times a day (QID) | ORAL | Status: DC | PRN

## 2020-01-23 MED ORDER — KETOROLAC TROMETHAMINE 15 MG/ML IJ SOLN
7.5000 mg | Freq: Four times a day (QID) | INTRAMUSCULAR | Status: AC
  Administered 2020-01-23 – 2020-01-24 (×4): 7.5 mg via INTRAVENOUS
  Filled 2020-01-23 (×4): qty 1

## 2020-01-23 MED ORDER — POLYETHYLENE GLYCOL 3350 17 G PO PACK: 17.0000 g | PACK | Freq: Every day | ORAL | Status: DC | PRN

## 2020-01-23 MED ORDER — METHOCARBAMOL 500 MG PO TABS: 500.0000 mg | ORAL_TABLET | Freq: Four times a day (QID) | ORAL | Status: DC | PRN

## 2020-01-23 MED ORDER — OXYCODONE HCL 5 MG/5ML PO SOLN: 5.0000 mg | Freq: Once | ORAL | Status: DC | PRN

## 2020-01-23 MED ORDER — MEPERIDINE HCL 50 MG/ML IJ SOLN: 6.2500 mg | INTRAMUSCULAR | Status: DC | PRN

## 2020-01-23 MED ORDER — FENTANYL CITRATE (PF) 100 MCG/2ML IJ SOLN
25.0000 ug | INTRAMUSCULAR | Status: DC | PRN
  Administered 2020-01-23 (×3): 50 ug via INTRAVENOUS

## 2020-01-23 MED ORDER — BUPIVACAINE LIPOSOME 1.3 % IJ SUSP
INTRAMUSCULAR | Status: DC | PRN
  Administered 2020-01-23: 10 mL via PERINEURAL

## 2020-01-23 MED ORDER — EPHEDRINE 5 MG/ML INJ
INTRAVENOUS | Status: AC
  Filled 2020-01-23: qty 10

## 2020-01-23 MED ORDER — PANTOPRAZOLE SODIUM 20 MG PO TBEC
20.0000 mg | DELAYED_RELEASE_TABLET | Freq: Every day | ORAL | Status: DC
  Administered 2020-01-23: 20 mg via ORAL
  Filled 2020-01-23 (×2): qty 1

## 2020-01-23 MED ORDER — FENTANYL CITRATE (PF) 100 MCG/2ML IJ SOLN
50.0000 ug | Freq: Once | INTRAMUSCULAR | Status: AC
  Administered 2020-01-23: 100 ug via INTRAVENOUS
  Filled 2020-01-23: qty 2

## 2020-01-23 MED ORDER — MENTHOL 3 MG MT LOZG: 1.0000 | LOZENGE | OROMUCOSAL | Status: DC | PRN

## 2020-01-23 MED ORDER — ONDANSETRON HCL 4 MG/2ML IJ SOLN
INTRAMUSCULAR | Status: DC | PRN
  Administered 2020-01-23: 4 mg via INTRAVENOUS

## 2020-01-23 MED ORDER — ACETAMINOPHEN 325 MG PO TABS: 325.0000 mg | ORAL_TABLET | ORAL | Status: DC | PRN

## 2020-01-23 MED ORDER — BISACODYL 5 MG PO TBEC: 5.0000 mg | DELAYED_RELEASE_TABLET | Freq: Every day | ORAL | Status: DC | PRN

## 2020-01-23 MED ORDER — DEXAMETHASONE SODIUM PHOSPHATE 10 MG/ML IJ SOLN
INTRAMUSCULAR | Status: AC
  Filled 2020-01-23: qty 1

## 2020-01-23 MED ORDER — LACTATED RINGERS IV SOLN: INTRAVENOUS | Status: DC

## 2020-01-23 MED ORDER — ONDANSETRON HCL 4 MG/2ML IJ SOLN
INTRAMUSCULAR | Status: AC
  Filled 2020-01-23: qty 2

## 2020-01-23 MED ORDER — SODIUM CHLORIDE 0.9 % IR SOLN
Status: DC | PRN
  Administered 2020-01-23: 1000 mL

## 2020-01-23 MED ORDER — FENTANYL CITRATE (PF) 100 MCG/2ML IJ SOLN
INTRAMUSCULAR | Status: AC
  Filled 2020-01-23: qty 2

## 2020-01-23 MED ORDER — HYDROMORPHONE HCL 1 MG/ML IJ SOLN: 0.5000 mg | INTRAMUSCULAR | Status: DC | PRN

## 2020-01-23 MED ORDER — STERILE WATER FOR IRRIGATION IR SOLN
Status: DC | PRN
  Administered 2020-01-23: 2000 mL

## 2020-01-23 MED ORDER — ROCURONIUM BROMIDE 10 MG/ML (PF) SYRINGE
PREFILLED_SYRINGE | INTRAVENOUS | Status: AC
  Filled 2020-01-23: qty 10

## 2020-01-23 MED ORDER — ACETAMINOPHEN 160 MG/5ML PO SOLN: 325.0000 mg | ORAL | Status: DC | PRN

## 2020-01-23 MED ORDER — DOCUSATE SODIUM 100 MG PO CAPS
100.0000 mg | ORAL_CAPSULE | Freq: Two times a day (BID) | ORAL | Status: DC
  Filled 2020-01-23: qty 1

## 2020-01-23 MED ORDER — DEXAMETHASONE SODIUM PHOSPHATE 10 MG/ML IJ SOLN
INTRAMUSCULAR | Status: DC | PRN
  Administered 2020-01-23: 8 mg via INTRAVENOUS

## 2020-01-23 MED ORDER — METOCLOPRAMIDE HCL 5 MG PO TABS: 5.0000 mg | ORAL_TABLET | Freq: Three times a day (TID) | ORAL | Status: DC | PRN

## 2020-01-23 MED ORDER — PHENOL 1.4 % MT LIQD: 1.0000 | OROMUCOSAL | Status: DC | PRN

## 2020-01-23 MED ORDER — OXYCODONE HCL 5 MG PO TABS: 10.0000 mg | ORAL_TABLET | ORAL | Status: DC | PRN

## 2020-01-23 MED ORDER — BUPIVACAINE-EPINEPHRINE (PF) 0.5% -1:200000 IJ SOLN
INTRAMUSCULAR | Status: DC | PRN
  Administered 2020-01-23: 15 mL via PERINEURAL

## 2020-01-23 MED ORDER — OXYCODONE HCL 5 MG PO TABS: 5.0000 mg | ORAL_TABLET | ORAL | Status: DC | PRN

## 2020-01-23 MED ORDER — PANTOPRAZOLE SODIUM 40 MG PO TBEC: 40.0000 mg | DELAYED_RELEASE_TABLET | Freq: Every day | ORAL | Status: DC

## 2020-01-23 MED ORDER — DIPHENHYDRAMINE HCL 12.5 MG/5ML PO ELIX: 12.5000 mg | ORAL_SOLUTION | ORAL | Status: DC | PRN

## 2020-01-23 MED ORDER — ALFUZOSIN HCL ER 10 MG PO TB24
10.0000 mg | ORAL_TABLET | Freq: Every evening | ORAL | Status: DC | PRN
  Filled 2020-01-23: qty 1

## 2020-01-23 MED ORDER — ONDANSETRON HCL 4 MG PO TABS: 4.0000 mg | ORAL_TABLET | Freq: Four times a day (QID) | ORAL | Status: DC | PRN

## 2020-01-23 MED ORDER — ALUM & MAG HYDROXIDE-SIMETH 200-200-20 MG/5ML PO SUSP: 30.0000 mL | ORAL | Status: DC | PRN

## 2020-01-23 MED ORDER — IRBESARTAN 150 MG PO TABS: 150.0000 mg | ORAL_TABLET | Freq: Every day | ORAL | Status: DC

## 2020-01-23 MED ORDER — ONDANSETRON HCL 4 MG/2ML IJ SOLN: 4.0000 mg | Freq: Once | INTRAMUSCULAR | Status: DC | PRN

## 2020-01-23 MED ORDER — PHENYLEPHRINE 40 MCG/ML (10ML) SYRINGE FOR IV PUSH (FOR BLOOD PRESSURE SUPPORT)
PREFILLED_SYRINGE | INTRAVENOUS | Status: DC | PRN
  Administered 2020-01-23: 120 ug via INTRAVENOUS

## 2020-01-23 MED ORDER — TRANEXAMIC ACID-NACL 1000-0.7 MG/100ML-% IV SOLN
1000.0000 mg | INTRAVENOUS | Status: AC
  Administered 2020-01-23: 1000 mg via INTRAVENOUS
  Filled 2020-01-23: qty 100

## 2020-01-23 MED ORDER — MAGNESIUM CITRATE PO SOLN: 1.0000 | Freq: Once | ORAL | Status: DC | PRN

## 2020-01-23 MED ORDER — METHOCARBAMOL 1000 MG/10ML IJ SOLN
500.0000 mg | Freq: Four times a day (QID) | INTRAVENOUS | Status: DC | PRN
  Filled 2020-01-23: qty 5

## 2020-01-23 MED ORDER — FENTANYL CITRATE (PF) 100 MCG/2ML IJ SOLN
INTRAMUSCULAR | Status: DC | PRN
Start: 2020-01-23 — End: 2020-01-23
  Administered 2020-01-23 (×2): 25 ug via INTRAVENOUS
  Administered 2020-01-23: 50 ug via INTRAVENOUS

## 2020-01-23 MED ORDER — MIDAZOLAM HCL 2 MG/2ML IJ SOLN
1.0000 mg | Freq: Once | INTRAMUSCULAR | Status: AC
  Administered 2020-01-23: 2 mg via INTRAVENOUS
  Filled 2020-01-23: qty 2

## 2020-01-23 MED ORDER — ONDANSETRON HCL 4 MG/2ML IJ SOLN: 4.0000 mg | Freq: Four times a day (QID) | INTRAMUSCULAR | Status: DC | PRN

## 2020-01-23 MED ORDER — PROPOFOL 10 MG/ML IV BOLUS
INTRAVENOUS | Status: AC
  Filled 2020-01-23: qty 20

## 2020-01-23 SURGICAL SUPPLY — 77 items
ADH SKN CLS APL DERMABOND .7 (GAUZE/BANDAGES/DRESSINGS) ×1
AID PSTN UNV HD RSTRNT DISP (MISCELLANEOUS) ×1
BAG SPEC THK2 15X12 ZIP CLS (MISCELLANEOUS) ×1
BAG ZIPLOCK 12X15 (MISCELLANEOUS) ×3 IMPLANT
BLADE SAW SGTL 83.5X18.5 (BLADE) ×3 IMPLANT
BSPLAT GLND +2X24 MDLR (Joint) ×1 IMPLANT
COOLER ICEMAN CLASSIC (MISCELLANEOUS) IMPLANT
COVER BACK TABLE 60X90IN (DRAPES) ×3 IMPLANT
COVER SURGICAL LIGHT HANDLE (MISCELLANEOUS) ×3 IMPLANT
COVER WAND RF STERILE (DRAPES) IMPLANT
CUP SUT UNIV REVERS 39 NEU (Shoulder) ×2 IMPLANT
DERMABOND ADVANCED (GAUZE/BANDAGES/DRESSINGS) ×2
DERMABOND ADVANCED .7 DNX12 (GAUZE/BANDAGES/DRESSINGS) ×1 IMPLANT
DRAPE INCISE IOBAN 66X45 STRL (DRAPES) IMPLANT
DRAPE ORTHO SPLIT 77X108 STRL (DRAPES) ×6
DRAPE SHEET LG 3/4 BI-LAMINATE (DRAPES) ×3 IMPLANT
DRAPE SURG 17X11 SM STRL (DRAPES) ×3 IMPLANT
DRAPE SURG ORHT 6 SPLT 77X108 (DRAPES) ×2 IMPLANT
DRAPE U-SHAPE 47X51 STRL (DRAPES) ×3 IMPLANT
DRSG AQUACEL AG ADV 3.5X10 (GAUZE/BANDAGES/DRESSINGS) ×3 IMPLANT
DURAPREP 26ML APPLICATOR (WOUND CARE) ×3 IMPLANT
ELECT BLADE TIP CTD 4 INCH (ELECTRODE) ×3 IMPLANT
ELECT REM PT RETURN 15FT ADLT (MISCELLANEOUS) ×3 IMPLANT
FACESHIELD WRAPAROUND (MASK) ×12 IMPLANT
FACESHIELD WRAPAROUND OR TEAM (MASK) ×4 IMPLANT
GLENOID UNI REV MOD 24 +2 LAT (Joint) ×2 IMPLANT
GLENOSPHERE 39+4 LAT/24 UNI RV (Joint) ×2 IMPLANT
GLOVE BIO SURGEON STRL SZ7.5 (GLOVE) ×3 IMPLANT
GLOVE BIO SURGEON STRL SZ8 (GLOVE) ×3 IMPLANT
GLOVE SS BIOGEL STRL SZ 7 (GLOVE) ×1 IMPLANT
GLOVE SS BIOGEL STRL SZ 7.5 (GLOVE) ×1 IMPLANT
GLOVE SUPERSENSE BIOGEL SZ 7 (GLOVE) ×2
GLOVE SUPERSENSE BIOGEL SZ 7.5 (GLOVE) ×2
GLOVE SURG SYN 7.0 (GLOVE) IMPLANT
GLOVE SURG SYN 7.0 PF PI (GLOVE) IMPLANT
GLOVE SURG SYN 7.5  E (GLOVE)
GLOVE SURG SYN 7.5 E (GLOVE) IMPLANT
GLOVE SURG SYN 7.5 PF PI (GLOVE) IMPLANT
GLOVE SURG SYN 8.0 (GLOVE) IMPLANT
GLOVE SURG SYN 8.0 PF PI (GLOVE) IMPLANT
GOWN STRL REUS W/TWL LRG LVL3 (GOWN DISPOSABLE) ×6 IMPLANT
INSERT HUMERAL MED 39/ +3 (Shoulder) IMPLANT
INSERT MEDIUM HUMERAL 39/ +3 (Shoulder) ×3 IMPLANT
KIT BASIN OR (CUSTOM PROCEDURE TRAY) ×3 IMPLANT
KIT TURNOVER KIT A (KITS) IMPLANT
MANIFOLD NEPTUNE II (INSTRUMENTS) ×3 IMPLANT
NDL TAPERED W/ NITINOL LOOP (MISCELLANEOUS) ×1 IMPLANT
NEEDLE TAPERED W/ NITINOL LOOP (MISCELLANEOUS) ×3 IMPLANT
NS IRRIG 1000ML POUR BTL (IV SOLUTION) ×3 IMPLANT
PACK SHOULDER (CUSTOM PROCEDURE TRAY) ×3 IMPLANT
PAD ARMBOARD 7.5X6 YLW CONV (MISCELLANEOUS) ×3 IMPLANT
PAD COLD SHLDR WRAP-ON (PAD) IMPLANT
PIN NITINOL TARGETER 2.8 (PIN) IMPLANT
PIN SET MODULAR GLENOID SYSTEM (PIN) ×2 IMPLANT
RESTRAINT HEAD UNIVERSAL NS (MISCELLANEOUS) ×3 IMPLANT
SCREW CENTRAL MODULAR 25 (Screw) ×2 IMPLANT
SCREW PERI LOCK 5.5X16 (Screw) ×2 IMPLANT
SCREW PERI LOCK 5.5X24 (Screw) ×4 IMPLANT
SCREW PERI LOCK 5.5X32 (Screw) ×2 IMPLANT
SLING ARM FOAM STRAP LRG (SOFTGOODS) ×2 IMPLANT
SLING ARM FOAM STRAP MED (SOFTGOODS) IMPLANT
SPACER REVERSE UNI 39/ +6MM (Shoulder) ×1 IMPLANT
SPACER SHLD UNI REV 39 +6 (Shoulder) ×1 IMPLANT
SPONGE LAP 18X18 RF (DISPOSABLE) IMPLANT
STEM HUMERAL UNIVERS SZ8 (Stem) ×2 IMPLANT
SUCTION FRAZIER HANDLE 12FR (TUBING) ×3
SUCTION TUBE FRAZIER 12FR DISP (TUBING) ×1 IMPLANT
SUT FIBERWIRE #2 38 T-5 BLUE (SUTURE)
SUT MNCRL AB 3-0 PS2 18 (SUTURE) ×3 IMPLANT
SUT MON AB 2-0 CT1 36 (SUTURE) ×3 IMPLANT
SUT VIC AB 1 CT1 36 (SUTURE) ×3 IMPLANT
SUTURE FIBERWR #2 38 T-5 BLUE (SUTURE) IMPLANT
SUTURE TAPE 1.3 40 TPR END (SUTURE) ×2 IMPLANT
SUTURETAPE 1.3 40 TPR END (SUTURE) ×6
TOWEL OR 17X26 10 PK STRL BLUE (TOWEL DISPOSABLE) ×3 IMPLANT
TOWEL OR NON WOVEN STRL DISP B (DISPOSABLE) ×3 IMPLANT
WATER STERILE IRR 1000ML POUR (IV SOLUTION) ×6 IMPLANT

## 2020-01-23 NOTE — Transfer of Care (Signed)
Immediate Anesthesia Transfer of Care Note  Patient: Gabriel Mcpherson  Procedure(s) Performed: REVERSE SHOULDER ARTHROPLASTY (Left Shoulder)  Patient Location: PACU  Anesthesia Type:General  Level of Consciousness: awake, alert  and oriented  Airway & Oxygen Therapy: Patient Spontanous Breathing and Patient connected to face mask oxygen  Post-op Assessment: Report given to RN and Post -op Vital signs reviewed and stable  Post vital signs: Reviewed and stable  Last Vitals:  Vitals Value Taken Time  BP    Temp    Pulse 79 01/23/20 1114  Resp 19 01/23/20 1114  SpO2 100 % 01/23/20 1114  Vitals shown include unvalidated device data.  Last Pain:  Vitals:   01/23/20 0924  PainSc: 0-No pain         Complications: No complications documented.

## 2020-01-23 NOTE — Progress Notes (Signed)
Assisted Dr. Oddono with left, ultrasound guided, interscalene  block. Side rails up, monitors on throughout procedure. See vital signs in flow sheet. Tolerated Procedure well. 

## 2020-01-23 NOTE — Anesthesia Postprocedure Evaluation (Signed)
Anesthesia Post Note  Patient: Gabriel Mcpherson  Procedure(s) Performed: REVERSE SHOULDER ARTHROPLASTY (Left Shoulder)     Patient location during evaluation: PACU Anesthesia Type: General Level of consciousness: awake and alert Pain management: pain level controlled Vital Signs Assessment: post-procedure vital signs reviewed and stable Respiratory status: spontaneous breathing, nonlabored ventilation, respiratory function stable and patient connected to nasal cannula oxygen Cardiovascular status: blood pressure returned to baseline and stable Postop Assessment: no apparent nausea or vomiting Anesthetic complications: no   No complications documented.  Last Vitals:  Vitals:   01/23/20 1245 01/23/20 1306  BP: 133/64 127/72  Pulse: 71 69  Resp: 20 16  Temp:  36.5 C  SpO2: 97% 96%    Last Pain:  Vitals:   01/23/20 1306  TempSrc: Oral  PainSc: 4                  Amely Voorheis

## 2020-01-23 NOTE — Discharge Instructions (Signed)
 Kevin M. Supple, M.D., F.A.A.O.S. Orthopaedic Surgery Specializing in Arthroscopic and Reconstructive Surgery of the Shoulder [phone] [address] - , [place] [address] - Fax [phone]   POST-OP TOTAL SHOULDER REPLACEMENT INSTRUCTIONS  1. Follow up in the office for your first post-op appointment 10-14 days from the date of your surgery. If you do not already have a scheduled appointment, our office will contact you to schedule.  2. The bandage over your incision is waterproof. You may begin showering with this dressing on. You may leave this dressing on until first follow up appointment within 2 weeks. We prefer you leave this dressing in place until follow up however after 5-7 days if you are having itching or skin irritation and would like to remove it you may do so. Go slow and tug at the borders gently to break the bond the dressing has with the skin. At this point if there is no drainage it is okay to go without a bandage or you may cover it with a light guaze and tape. You can also expect significant bruising around your shoulder that will drift down your arm and into your chest wall. This is very normal and should resolve over several days.   3. Wear your sling/immobilizer at all times except to perform the exercises below or to occasionally let your arm dangle by your side to stretch your elbow. You also need to sleep in your sling immobilizer until instructed otherwise. It is ok to remove your sling if you are sitting in a controlled environment and allow your arm to rest in a position of comfort by your side or on your lap with pillows to give your neck and skin a break from the sling. You may remove it to allow arm to dangle by side to shower. If you are up walking around and when you go to sleep at night you need to wear it.  4. Range of motion to your elbow, wrist, and hand are encouraged 3-5 times daily. Exercise to your hand and fingers helps to reduce  swelling you may experience.   5. Prescriptions for a pain medication and a muscle relaxant are provided for you. It is recommended that if you are experiencing pain that you pain medication alone is not controlling, add the muscle relaxant along with the pain medication which can give additional pain relief. The first 1-2 days is generally the most severe of your pain and then should gradually decrease. As your pain lessens it is recommended that you decrease your use of the pain medications to an "as needed basis'" only and to always comply with the recommended dosages of the pain medications.  6. Pain medications can produce constipation along with their use. If you experience this, the use of an over the counter stool softener or laxative daily is recommended.   7. For additional questions or concerns, please do not hesitate to call the office. If after hours there is an answering service to forward your concerns to the physician on call.  8.Pain control following an exparel block  To help control your post-operative pain you received a nerve block  performed with Exparel which is a long acting anesthetic (numbing agent) which can provide pain relief and sensations of numbness (and relief of pain) in the operative shoulder and arm for up to 3 days. Sometimes it provides mixed relief, meaning you may still have numbness in certain areas of the arm but can still be able to   move  parts of that arm, hand, and fingers. We recommend that your prescribed pain medications  be used as needed. We do not feel it is necessary to "pre medicate" and "stay ahead" of pain.  Taking narcotic pain medications when you are not having any pain can lead to unnecessary and potentially dangerous side effects.    9. Use the ice machine as much as possible in the first 5-7 days from surgery, then you can wean its use to as needed. The ice typically needs to be replaced every 6 hours, instead of ice you can actually freeze  water bottles to put in the cooler and then fill water around them to avoid having to purchase ice. You can have spare water bottles freezing to allow you to rotate them once they have melted. Try to have a thin shirt or light cloth or towel under the ice wrap to protect your skin.   10.  We recommend that you avoid any dental work or cleaning in the first 3 months following your joint replacement. This is to help minimize the possibility of infection from the bacteria in your mouth that enters your bloodstream during dental work. We also recommend that you take an antibiotic prior to your dental work for the first year after your shoulder replacement to further help reduce that risk. Please simply contact our office for antibiotics to be sent to your pharmacy prior to dental work.  11. Dental Antibiotics:  In most cases prophylactic antibiotics for Dental procdeures after total joint surgery are not necessary.  Exceptions are as follows:  1. History of prior total joint infection  2. Severely immunocompromised (Organ Transplant, cancer chemotherapy, Rheumatoid biologic meds such as Humera)  3. Poorly controlled diabetes (A1C &gt; 8.0, blood glucose over 200)  If you have one of these conditions, contact your surgeon for an antibiotic prescription, prior to your dental procedure.   POST-OP EXERCISES  Pendulum Exercises  Perform pendulum exercises while standing and bending at the waist. Support your uninvolved arm on a table or chair and allow your operated arm to hang freely. Make sure to do these exercises passively - not using you shoulder muscles. These exercises can be performed once your nerve block effects have worn off.  Repeat 20 times. Do 3 sessions per day.     

## 2020-01-23 NOTE — Anesthesia Preprocedure Evaluation (Signed)
Anesthesia Evaluation  Patient identified by MRN, date of birth, ID band Patient awake    Reviewed: Allergy & Precautions, NPO status , Patient's Chart, lab work & pertinent test results  History of Anesthesia Complications Negative for: history of anesthetic complications  Airway Mallampati: II  TM Distance: >3 FB Neck ROM: Full    Dental no notable dental hx.    Pulmonary neg pulmonary ROS,    Pulmonary exam normal        Cardiovascular hypertension, Pt. on medications Normal cardiovascular exam     Neuro/Psych negative neurological ROS  negative psych ROS   GI/Hepatic Neg liver ROS, hiatal hernia, GERD  Medicated and Controlled,  Endo/Other  diabetes, Type 2, Oral Hypoglycemic Agents  Renal/GU negative Renal ROS   BPH    Musculoskeletal  (+) Arthritis ,   Abdominal   Peds  Hematology negative hematology ROS (+)   Anesthesia Other Findings Day of surgery medications reviewed with patient.  Reproductive/Obstetrics negative OB ROS                             Anesthesia Physical  Anesthesia Plan  ASA: III  Anesthesia Plan: General   Post-op Pain Management: GA combined w/ Regional for post-op pain   Induction: Intravenous  PONV Risk Score and Plan: 2 and Treatment may vary due to age or medical condition, Ondansetron and Dexamethasone  Airway Management Planned: LMA and Oral ETT  Additional Equipment: None  Intra-op Plan:   Post-operative Plan: Extubation in OR  Informed Consent: I have reviewed the patients History and Physical, chart, labs and discussed the procedure including the risks, benefits and alternatives for the proposed anesthesia with the patient or authorized representative who has indicated his/her understanding and acceptance.     Dental advisory given  Plan Discussed with: CRNA and Anesthesiologist  Anesthesia Plan Comments:          Anesthesia Quick Evaluation

## 2020-01-23 NOTE — Plan of Care (Signed)
  Problem: Education: Goal: Knowledge of the prescribed therapeutic regimen will improve Outcome: Progressing Goal: Understanding of activity limitations/precautions following surgery will improve Outcome: Progressing   Problem: Activity: Goal: Ability to tolerate increased activity will improve Outcome: Progressing   Problem: Pain Management: Goal: Pain level will decrease with appropriate interventions Outcome: Progressing   

## 2020-01-23 NOTE — Op Note (Signed)
01/23/2020  11:00 AM  PATIENT:   Gabriel Mcpherson  79 y.o. 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  ASSISTANTS: Ralene Bathe, PA-C  ANESTHESIA:   General endotracheal and interscalene block with Exparel  EBL: 150 cc  SPECIMEN: None  Drains: None   PATIENT DISPOSITION:  PACU - hemodynamically stable.    PLAN OF CARE: Admit for overnight observation  Brief history:  Mr. Blades is an 79 year old gentleman who has had chronic and progressively increasing left shoulder pain with profoundly restricted mobility related to advanced osteoarthritis with severe bony deformity and associated rotator cuff dysfunction.  Due to his increasing pain and functional mutations he is brought to the operating this time for planned left shoulder reverse arthroplasty.  Preoperatively, I counseled the patient regarding treatment options and risks versus benefits thereof.  Possible surgical complications were all reviewed including potential for bleeding, infection, neurovascular injury, persistent pain, loss of motion, anesthetic complication, failure of the implant, and possible need for additional surgery. They understand and accept and agrees with our planned procedure.   Procedure in detail:  After undergoing routine preop evaluation the patient received prophylactic antibiotics and interscalene block with Exparel was established in the holding area by the anesthesia department.  Patient subsequently placed supine on the operating table and underwent the smooth induction of a general endotracheal anesthesia.  Placed into the beachchair position and appropriately padded and protected.  Left shoulder girdle region was  sterilely prepped and draped in standard fashion.  Timeout was called.  An anterior deltopectoral approach left shoulder is made through a 10 cm incision.  Skin flaps were elevated dissection carried deeply and electrocautery was used for hemostasis.  The deltopectoral interval was then developed from proximal to distal with the vein taken laterally conjoined tendon mobilized and retracted medially and adhesions divided beneath the deltoid and the upper centimeter of the pectoralis major was tenotomized for improved exposure.  The long head biceps tendon was then tenodesed at the upper border the pectoralis major tendon and the proximal segment was unroofed and excised.  The subscapularis was then divided from its lesser tuberosity insertion and on examination the shoulder had essentially no internal or external rotation and so the subscapularis was found to be severely fibrotic and nonfunctioning.  We did tag it and then reflected this medially and then divided the capsular attachments from the anterior and infra margins of the humeral neck ultimately would deliver the humeral head through the wound which again was noted to be severely dysmorphic with deformity and large peripheral osteophytes.  Superior remnants of the rotator cuff were released to enhance exposure.  Extra medullary guide was then used to outline our proposed humeral head resection which was completed with an oscillating saw.  Large osteophytes on the margins of the humeral neck were then removed with rondure.  A metal cap was placed to the cut proximal humeral surface we then exposed the glenoid with appropriate retractors.  Glenoid was noted to be significantly "dished out".  A circumferential labral resection was then completed and this also allowed [place] access to remove a very large osteocartilaginous loose body in the axillary aspect of the joint capsule with care taken to dissect this free and remove it.  Once we  gained complete visualization  of the glenoid a guidepin was then directed into the center with an approximately 5 degree inferior tilt and the glenoid was then reamed with central followed by peripheral reamers to a stable subchondral bony bed.  Glenoid preparation was then completed with the drill and tap with a 25 mm lag screw selected.  The baseplate was then assembled and introduced with excellent fit and fixation.  The peripheral locking screws were all then placed with good fit and fixation.  A 39/+4 glenosphere was then impacted onto the baseplate and the central locking screw was placed.  We then returned our attention back to the proximal humerus where the canal was opened hand reaming to size 7 and broaching to size 8 with excellent fit and fixation and maintaining the native retroversion of approximate 20 degrees.  The metaphysis was then reamed with a neutral reaming guide.  A trial reduction showed good soft tissue balance and good stability and good motion.  Our final implant was then assembled and impacted into position.  A series of trial reductions was then performed and ultimately felt that a total of +9 off the implant gave [place] the best soft tissue balance and stability.  A +6 spacer followed by +3 poly were then inserted.  Final reduction showed excellent stability good motion good soft tissue balance.  The joint was copiously irrigated.  We then reinspected the subscapularis which was extremely fibrotic and nonfunctioning so we did not perform subscap repair.  Irrigation and hemostasis completed.  The deltopectoral interval was reapproximated with a series of figure-of-eight number Vicryl sutures.  2-0 Vicryl used for subcu layer and intracuticular 3-0 Monocryl for the skin followed by Dermabond and Aquacel dressing.  Left arm was then placed into a sling and the patient was awakened, extubated, and taken to the recovery room in stable condition.  Ralene Bathe, PA-C was utilized as an Geophysicist/field seismologist throughout this case,  essential for help with positioning the patient, positioning extremity, tissue manipulation, implantation of the prosthesis, suture management, wound closure, and intraoperative decision-making.  Senaida Lange MD   Contact # [phone]

## 2020-01-23 NOTE — H&P (Signed)
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 male with chronic and progressively increasing left shoulder pain and severely restricted mobility related to end stage osteoarthritis.  Due to his increasing functional imitations and failure to respond to prolonged attempts at conservative management he is brought to the operating room this time for planned left shoulder reverse arthroplasty.  Past Medical History:  Diagnosis Date  . Arthritis   . Benign localized prostatic hyperplasia with lower urinary tract symptoms (LUTS)   . Diverticulosis of colon   . GERD (gastroesophageal reflux disease)   . Hiatal hernia   . Hypertension    followed by pcp  (11-01-2019  per pt had stress test done 10-28-2019 @ cardiology office of Dr Catha Gosselin, was told normal)  . Intermittent palpitations    hx event monitor 2015 in care everywhere showed ST w/ frequent PVCs    . Type 2 diabetes mellitus (HCC)    type 2   . Wears hearing aid in both ears    per pt does not wear all the time    Past Surgical History:  Procedure Laterality Date  . bilateral toe surgery     bunioinectomy and other procedures   . CATARACT EXTRACTION W/ INTRAOCULAR LENS  IMPLANT, BILATERAL  2012  approx.  . CYSTOSCOPY WITH INSERTION OF UROLIFT  2019  . CYSTOSCOPY WITH INSERTION OF UROLIFT N/A 11/08/2019   Procedure: CYSTOSCOPY WITH INSERTION OF UROLIFT;  Surgeon: Jerilee Field, MD;  Location: [facility];  Service: Urology;  Laterality: N/A;  . HEMICOLECTOMY  1990s   and appendectomy for diverticulitis  . TONSILLECTOMY  child  . TOTAL KNEE ARTHROPLASTY Left 02-18-2014  [place]  [facility]    History reviewed. No pertinent family history.  Social History:  reports that he has never smoked. He has never used smokeless tobacco. He reports that he does not drink alcohol and does not use drugs.   Medications Prior to Admission    Medication Sig Dispense Refill  . alfuzosin (UROXATRAL) 10 MG 24 hr tablet Take 10 mg by mouth at bedtime as needed (urinary flow issues).     . Ascorbic Acid (VITAMIN C PO) Take 1 tablet by mouth daily.    . Cholecalciferol (VITAMIN D3 PO) Take 1 capsule by mouth daily.     . Cyanocobalamin (B-12 PO) Take 1 lozenge by mouth daily as needed (energy).     Marland Kitchen glipiZIDE (GLUCOTROL) 5 MG tablet Take 5 mg by mouth daily before breakfast.    . hydrochlorothiazide (HYDRODIURIL) 12.5 MG tablet Take 12.5 mg by mouth daily.     . metFORMIN (GLUCOPHAGE-XR) 500 MG 24 hr tablet Take 1,000 mg by mouth in the morning and at bedtime.   0  . Misc Natural Products (TURMERIC CURCUMIN) CAPS Take 1 capsule by mouth daily.    . Multiple Vitamin (MULTIVITAMIN) tablet Take 1 tablet by mouth daily.    . Multiple Vitamins-Minerals (ZINC PO) Take 1 tablet by mouth daily.    . pantoprazole (PROTONIX) 20 MG tablet Take 20 mg by mouth daily.    . simvastatin (ZOCOR) 20 MG tablet Take 20 mg by mouth at bedtime.     . valsartan (DIOVAN) 160 MG tablet Take 160 mg by mouth daily.    Marland Kitchen doxycycline (ADOXA) 100 MG tablet Take 1 tablet (100 mg total) by mouth 2 (two) times daily. (Patient not taking: Reported on 01/09/2020) 6 tablet 0  .  naproxen sodium (ALEVE) 220 MG tablet Take 220 mg by mouth daily as needed (pain).       Physical Exam: Left shoulder examination demonstrates profoundly restricted mobility and severe pain as noted at his recent office visits.  Plain radiographs confirm complete obliteration of the joint space with subchondral sclerosis and peripheral osteophyte formation.  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

## 2020-01-23 NOTE — Anesthesia Procedure Notes (Signed)
Anesthesia Regional Block: Interscalene brachial plexus block   Pre-Anesthetic Checklist: ,, timeout performed, Correct Patient, Correct Site, Correct Laterality, Correct Procedure, Correct Position, site marked, Risks and benefits discussed,  Surgical consent,  Pre-op evaluation,  At surgeon's request and post-op pain management  Laterality: Right  Prep: chloraprep       Needles:  Injection technique: Single-shot  Needle Type: Echogenic Stimulator Needle     Needle Length: 5cm  Needle Gauge: 22     Additional Needles:   Procedures:, nerve stimulator,,, ultrasound used (permanent image in chart),,,,   Nerve Stimulator or Paresthesia:  Response: hand, 0.45 mA,   Additional Responses:   Narrative:  Start time: 01/23/2020 9:20 AM End time: 01/23/2020 9:25 AM Injection made incrementally with aspirations every 5 mL.  Performed by: Personally  Anesthesiologist: Bethena Midget, MD  Additional Notes: Functioning IV was confirmed and monitors were applied.  A 70mm 22ga Arrow echogenic stimulator needle was used. Sterile prep and drape,hand hygiene and sterile gloves were used. Ultrasound guidance: relevant anatomy identified, needle position confirmed, local anesthetic spread visualized around nerve(s)., vascular puncture avoided.  Image printed for medical record. Negative aspiration and negative test dose prior to incremental administration of local anesthetic. The patient tolerated the procedure well.

## 2020-01-23 NOTE — Anesthesia Procedure Notes (Signed)
Procedure Name: Intubation Date/Time: 01/23/2020 9:47 AM Performed by: Niel Hummer, CRNA Pre-anesthesia Checklist: Patient identified, Emergency Drugs available, Suction available and Patient being monitored Patient Re-evaluated:Patient Re-evaluated prior to induction Oxygen Delivery Method: Circle system utilized Preoxygenation: Pre-oxygenation with 100% oxygen Induction Type: IV induction Ventilation: Oral airway inserted - appropriate to patient size and Two handed mask ventilation required Laryngoscope Size: Mac and 4 Grade View: Grade II Tube type: Oral Tube size: 7.5 mm Number of attempts: 1 Airway Equipment and Method: Stylet Placement Confirmation: ETT inserted through vocal cords under direct vision,  positive ETCO2 and breath sounds checked- equal and bilateral Secured at: 23 cm Tube secured with: Tape Dental Injury: Teeth and Oropharynx as per pre-operative assessment

## 2020-01-24 DIAGNOSIS — M19012 Primary osteoarthritis, left shoulder: Secondary | ICD-10-CM | POA: Diagnosis not present

## 2020-01-24 MED ORDER — CYCLOBENZAPRINE HCL 10 MG PO TABS: 10.0000 mg | ORAL_TABLET | Freq: Three times a day (TID) | ORAL | 1 refills | Status: DC | PRN

## 2020-01-24 MED ORDER — ONDANSETRON HCL 4 MG PO TABS: 4.0000 mg | ORAL_TABLET | Freq: Three times a day (TID) | ORAL | 0 refills | Status: AC | PRN

## 2020-01-24 MED ORDER — OXYCODONE-ACETAMINOPHEN 5-325 MG PO TABS
1.0000 | ORAL_TABLET | ORAL | 0 refills | Status: AC | PRN
Start: 2020-01-24 — End: ?

## 2020-01-24 NOTE — Evaluation (Signed)
Occupational Therapy Evaluation Patient Details Name: Gabriel Mcpherson MRN: [id number] DOB: June 16, 1940 Today's Date: 01/24/2020    History of Present Illness Patient s/p L reverse TSA   Clinical Impression   Mr. Kemarion Abbey is an 79 year old man s/p shoulder replacement without functional use of left non-dominant upper extremity secondary to effects of surgery and interscalene block and shoulder precautions. Therapist provided education and instruction to patient and spouse in regards to exercises, precautions, positioning, donning upper extremity clothing and bathing while maintaining shoulder precautions, ice and edema management and donning/doffing sling. Patient and spouse verbalized understanding. Patient needed assistance to donn shirt, underwear, pants, socks and shoes and provided with instruction on compensatory strategies to perform ADLs. Provided with handouts to maximize retention of instruction and exercises. Patient to follow up with MD for further therapy needs.      Follow Up Recommendations  Follow surgeon's recommendation for DC plan and follow-up therapies    Equipment Recommendations  None recommended by OT    Recommendations for Other Services       Precautions / Restrictions Precautions Precautions: Shoulder Type of Shoulder Precautions: AROM/PROM,AAROM to 45 Abduction, 60 FE, 20 ER, Shoulder Interventions: Shoulder sling/immobilizer;At all times;Off for dressing/bathing/exercises Restrictions Weight Bearing Restrictions: Yes LUE Weight Bearing: Non weight bearing      Mobility Bed Mobility Overal bed mobility: Modified Independent                Transfers Overall transfer level: Modified independent                    Balance Overall balance assessment: No apparent balance deficits (not formally assessed)                                         ADL either performed or assessed with clinical judgement   ADL                                          General ADL Comments: Required assistance to donn shirt, underwear, pants, socks and shoes with the assistance of wife. Instruction on how to donn upper body clothing and instruction to not pull up cloting with LUE     Vision Patient Visual Report: No change from baseline       Perception     Praxis      Pertinent Vitals/Pain Pain Assessment: No/denies pain     Hand Dominance     Extremity/Trunk Assessment Upper Extremity Assessment Upper Extremity Assessment: LUE deficits/detail LUE Deficits / Details: Able to open and close fingers, gross ROM of wrist, forearm and elbow but limited by block.           Communication     Cognition Arousal/Alertness: Awake/alert Behavior During Therapy: WFL for tasks assessed/performed Overall Cognitive Status: Within Functional Limits for tasks assessed                                     General Comments       Exercises     Shoulder Instructions Shoulder Instructions Donning/doffing shirt without moving shoulder: Caregiver independent with task;Patient able to independently direct caregiver Method for sponge bathing under operated UE: Patient able to independently  direct caregiver;Caregiver independent with task Donning/doffing sling/immobilizer: Patient able to independently direct caregiver;Caregiver independent with task Correct positioning of sling/immobilizer: Patient able to independently direct caregiver;Caregiver independent with task Pendulum exercises (written home exercise program): Patient able to independently direct caregiver;Caregiver independent with task ROM for elbow, wrist and digits of operated UE: Patient able to independently direct caregiver;Caregiver independent with task Sling wearing schedule (on at all times/off for ADL's): Patient able to independently direct caregiver;Caregiver independent with task Proper positioning of operated UE when  showering: Patient able to independently direct caregiver;Caregiver independent with task Dressing change: Patient able to independently direct caregiver;Caregiver independent with task Positioning of UE while sleeping: Patient able to independently direct caregiver;Caregiver independent with task    Home Living Family/patient expects to be discharged to:: Private residence Living Arrangements: Spouse/significant other Available Help at Discharge: Available 24 hours/day;Family                                    Prior Functioning/Environment                   OT Problem List:        OT Treatment/Interventions:      OT Goals(Current goals can be found in the care plan section) Acute Rehab OT Goals OT Goal Formulation: All assessment and education complete, DC therapy  OT Frequency:     Barriers to D/C:            Co-evaluation              AM-PAC OT "6 Clicks" Daily Activity     Outcome Measure Help from another person eating meals?: A Little Help from another person taking care of personal grooming?: A Little Help from another person toileting, which includes using toliet, bedpan, or urinal?: A Little Help from another person bathing (including washing, rinsing, drying)?: A Little Help from another person to put on and taking off regular upper body clothing?: A Lot Help from another person to put on and taking off regular lower body clothing?: A Lot 6 Click Score: 16   End of Session Nurse Communication:  (OT instruction complete)  Activity Tolerance: Patient tolerated treatment well Patient left: in chair;with call bell/phone within reach;with family/visitor present  OT Visit Diagnosis: Muscle weakness (generalized) (M62.81)                Time: 9509-3267 OT Time Calculation (min): 41 min Charges:  OT General Charges $OT Visit: 1 Visit OT Evaluation $OT Eval Low Complexity: 1 Low OT Treatments $Self Care/Home Management : 23-37  mins  Crysten Kaman, OTR/L Acute Care Rehab Services  Office [phone] Pager: [phone]   Kelli Churn 01/24/2020, 11:28 AM

## 2020-01-24 NOTE — Discharge Summary (Signed)
PATIENT ID:      Gabriel Mcpherson  MRN:     [id number] DOB/AGE:    81-Jun-1942 / 81 y.o.     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 Arthritis   . Benign localized prostatic hyperplasia with lower urinary tract symptoms (LUTS)   . Diverticulosis of colon   . GERD (gastroesophageal reflux disease)   . Hiatal hernia   . Hypertension    followed by pcp  (11-01-2019  per pt had stress test done 10-28-2019 @ cardiology office of Dr Catha Gosselin, was told normal)  . Intermittent palpitations    hx event monitor 2015 in care everywhere showed ST w/ frequent PVCs    . Type 2 diabetes mellitus (HCC)    type 2   . Wears hearing aid in both ears    per pt does not wear all the time    DISCHARGE DIAGNOSIS:   Active Problems:   S/P reverse total shoulder arthroplasty, left   PROCEDURE: Procedure(s): REVERSE SHOULDER ARTHROPLASTY on 01/23/2020  CONSULTS:    HISTORY:  See H&P in chart.  HOSPITAL COURSE:  Gabriel Mcpherson is a 79 y.o. 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  They were brought to the operating room on 01/23/2020 and underwent Procedure(s): REVERSE SHOULDER ARTHROPLASTY.    They were given perioperative antibiotics:  Anti-infectives (From admission, onward)   Start     Dose/Rate Route Frequency Ordered Stop   01/23/20 0800  ceFAZolin (ANCEF) IVPB 2g/100 mL premix        2 g 200 mL/hr over 30 Minutes Intravenous On call to O.R. 01/23/20 0749 01/23/20 1017    .  Patient underwent the above named procedure and tolerated it well. The following day they were hemodynamically stable and pain was controlled on oral analgesics. They were neurovascularly intact to the operative extremity. OT was ordered and worked with patient per protocol. They were medically and orthopaedically  stable for discharge on .    DIAGNOSTIC STUDIES:  RECENT RADIOGRAPHIC STUDIES :  No results found.  RECENT VITAL SIGNS:   Patient Vitals for the past 24 hrs:  BP Temp Temp src Pulse Resp SpO2 Height Weight  01/24/20 0632 136/71 98.4 F (36.9 C) Oral (!) 57 19 94 % -- --  01/24/20 0145 (!) 149/76 98.2 F (36.8 C) Oral (!) 56 19 97 % -- --  01/23/20 2200 135/71 98 F (36.7 C) Oral 63 20 97 % -- --  01/23/20 1839 130/63 98 F (36.7 C) Oral (!) 54 16 97 % -- --  01/23/20 1650 (!) 148/59 (!) 97.5 F (36.4 C) Oral (!) 54 16 98 % -- --  01/23/20 1540 140/77 97.9 F (36.6 C) Oral (!) 55 18 98 % -- --  01/23/20 1411 137/76 97.6 F (36.4 C) Oral 63 16 95 % -- --  01/23/20 1308 -- -- -- -- -- -- 5\' 10"  (1.778 m) --  01/23/20 1306 127/72 97.7 F (36.5 C) Oral 69 16 96 % -- --  01/23/20 1245 133/64 -- -- 71 20 97 % -- --  01/23/20 1230 (!) 144/61 97.8 F (36.6 C) -- 73 19 97 % -- --  01/23/20 1215 139/73 -- -- 61 16 98 % -- --  01/23/20 1200 133/68 -- -- 75 16 98 % -- --  01/23/20 1145 137/65 -- -- 74 16 98 % -- --  01/23/20 1130 137/72 -- -- 73 17 100 % -- --  01/23/20 1115 (!) 143/72 -- -- 80 17 100 % -- --  01/23/20 1114 (!) 143/65 97.8 F (36.6 C) -- 79 19 100 % -- --  01/23/20 0924 126/61 -- -- (!) 48 19 97 % -- --  01/23/20 0914 (!) 145/60 -- -- -- 20 96 % -- --  01/23/20 0813 -- -- -- -- -- -- -- 99.5 kg  .  RECENT EKG RESULTS:   No orders found for this or any previous visit.  DISCHARGE INSTRUCTIONS:    DISCHARGE MEDICATIONS:   Allergies as of 01/24/2020      Reactions   Clindamycin    Other reaction(s): GI Upset (intolerance)   Penicillins Hives   Ciprofloxacin Other (See Comments)   "light headed"   Sulfa Antibiotics Other (See Comments)   "light headed"   Tamsulosin Other (See Comments)   dizziness, nausea, and raising blood sugar level      Medication List    STOP taking these medications   doxycycline 100 MG tablet Commonly known as: ADOXA      TAKE these medications   alfuzosin 10 MG 24 hr tablet Commonly known as: UROXATRAL Take 10 mg by mouth at bedtime as needed (urinary flow issues).   B-12 PO Take 1 lozenge by mouth daily as needed (energy).   cyclobenzaprine 10 MG tablet Commonly known as: FLEXERIL Take 1 tablet (10 mg total) by mouth 3 (three) times daily as needed for muscle spasms.   glipiZIDE 5 MG tablet Commonly known as: GLUCOTROL Take 5 mg by mouth daily before breakfast.   hydrochlorothiazide 12.5 MG tablet Commonly known as: HYDRODIURIL Take 12.5 mg by mouth daily.   metFORMIN 500 MG 24 hr tablet Commonly known as: GLUCOPHAGE-XR Take 1,000 mg by mouth in the morning and at bedtime.   multivitamin tablet Take 1 tablet by mouth daily.   naproxen sodium 220 MG tablet Commonly known as: ALEVE Take 220 mg by mouth daily as needed (pain).   ondansetron 4 MG tablet Commonly known as: Zofran Take 1 tablet (4 mg total) by mouth every 8 (eight) hours as needed for nausea or vomiting.   oxyCODONE-acetaminophen 5-325 MG tablet Commonly known as: Percocet Take 1 tablet by mouth every 4 (four) hours as needed (max 6 q).   pantoprazole 20 MG tablet Commonly known as: PROTONIX Take 20 mg by mouth daily.   simvastatin 20 MG tablet Commonly known as: ZOCOR Take 20 mg by mouth at bedtime.   Turmeric Curcumin Caps Take 1 capsule by mouth daily.   valsartan 160 MG tablet Commonly known as: DIOVAN Take 160 mg by mouth daily.   VITAMIN C PO Take 1 tablet by mouth daily.   VITAMIN D3 PO Take 1 capsule by mouth daily.   ZINC PO Take 1 tablet by mouth daily.       FOLLOW UP VISIT:    Follow-up Information    Francena Hanly, MD.   Specialty: Orthopedic Surgery Why: on 10/27 at 11:00 Contact information: [address] [place] [address] [phone]               DISCHARGE TO: Home   DISCHARGE CONDITION:  Gabriel Mcpherson for Dr. Francena Hanly 01/24/2020, 8:05 AM

## 2020-01-27 ENCOUNTER — Encounter (HOSPITAL_COMMUNITY): Payer: Self-pay | Admitting: Orthopedic Surgery

## 2020-04-04 IMAGING — US US SCROTUM W/ DOPPLER COMPLETE
1 series · 13 of 25 positions shown · non-contrast
Comparison: None.

CLINICAL DATA: Scrotal swelling

EXAM:
SCROTAL ULTRASOUND
DOPPLER ULTRASOUND OF THE TESTICLES
TECHNIQUE: Complete ultrasound examination of the testicles, epididymis, and
other scrotal structures was performed. Color and spectral Doppler
ultrasound were also utilized to evaluate blood flow to the
testicles.

[Series 1: us scrotum w/ doppler complete · 0.08mm/px · 13 of 57 slices shown]
[im 1/57]
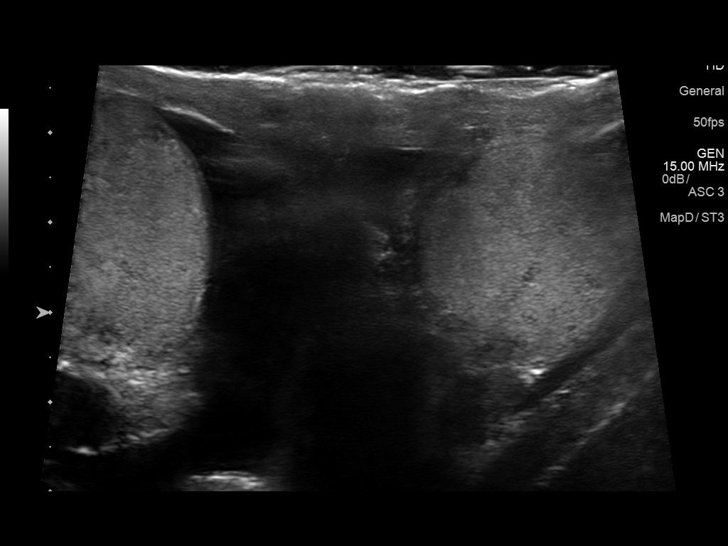
[im 5/57]
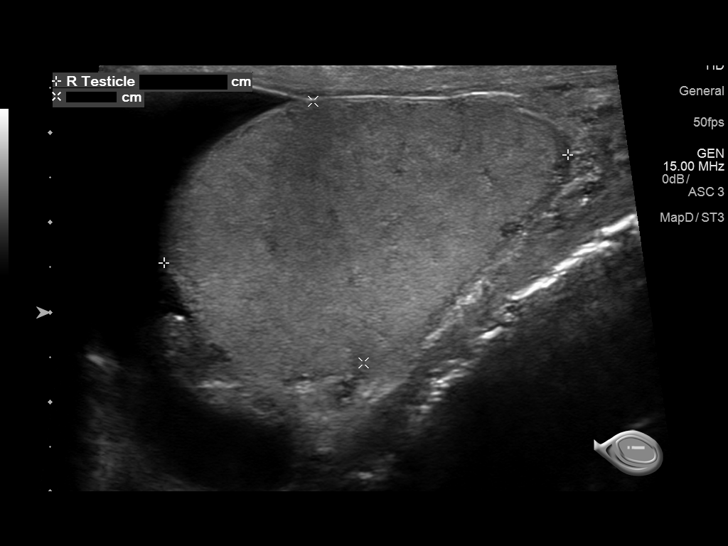
[im 10/57]
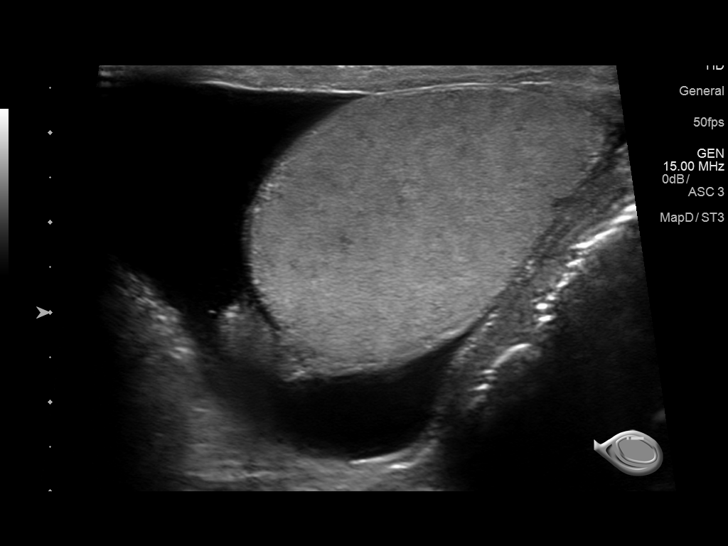
[im 15/57]
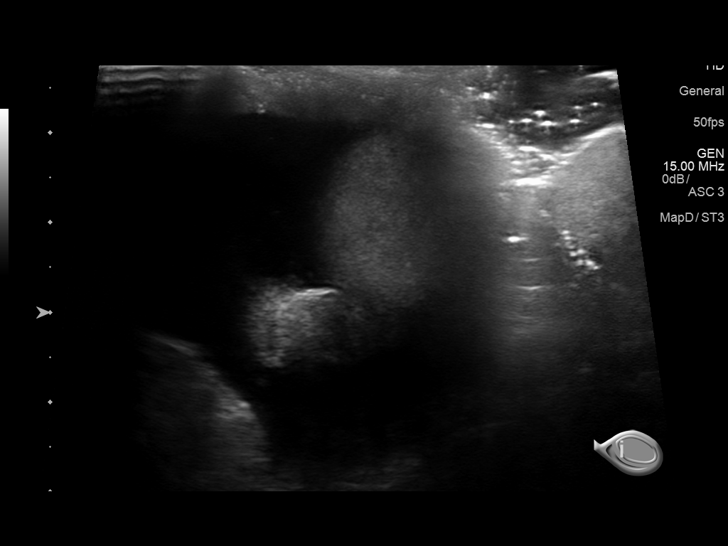
[im 19/57]
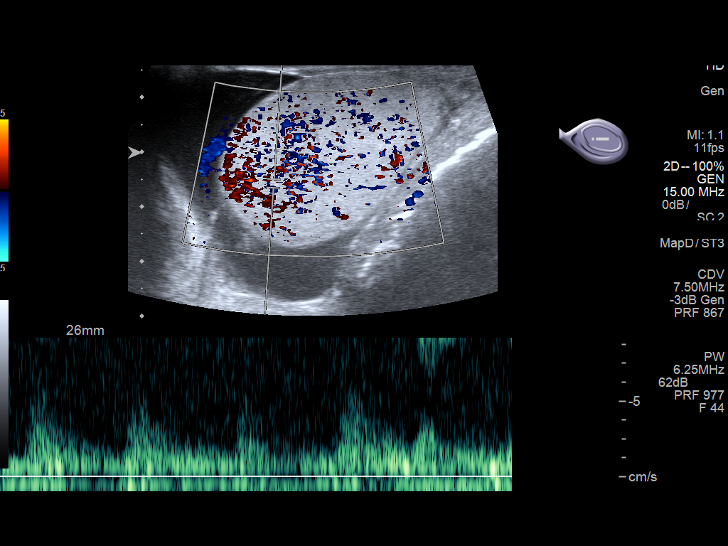
[im 24/57]
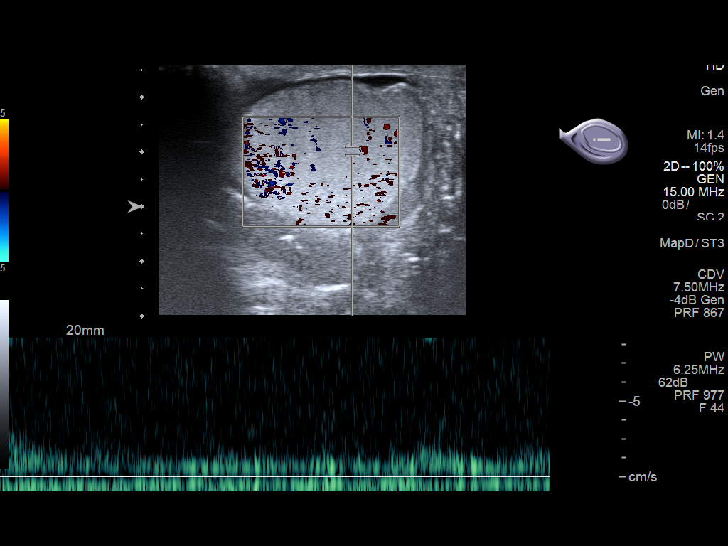
[im 29/57]
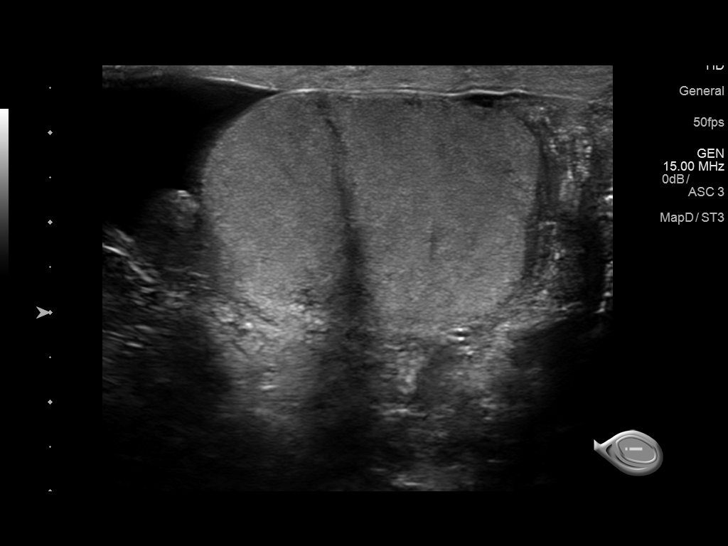
[im 33/57]
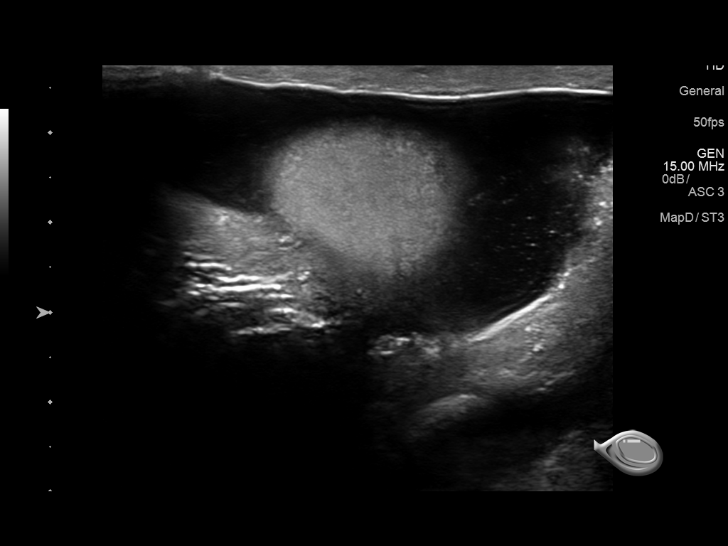
[im 38/57]
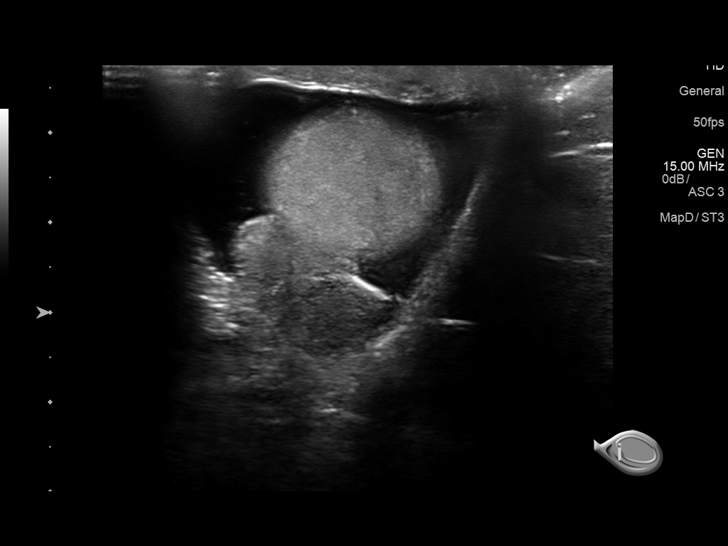
[im 43/57]
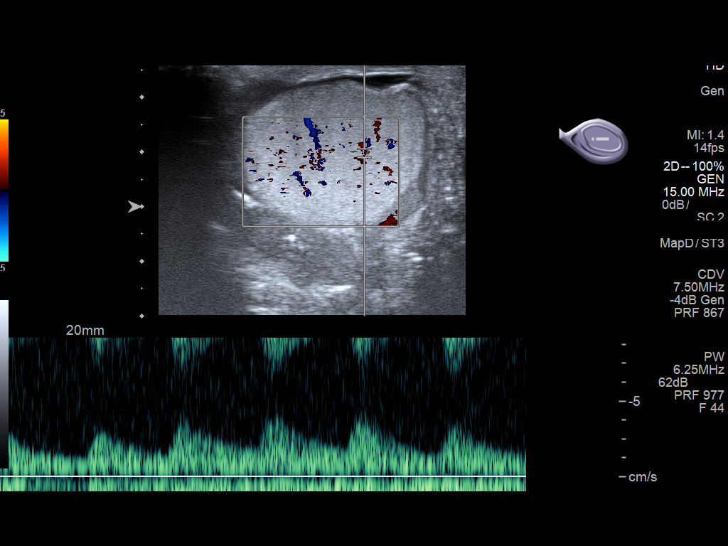
[im 47/57]
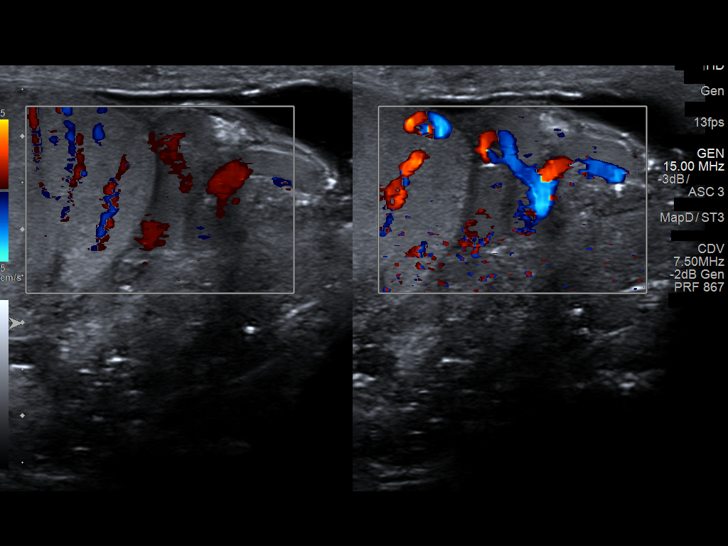
[im 52/57]
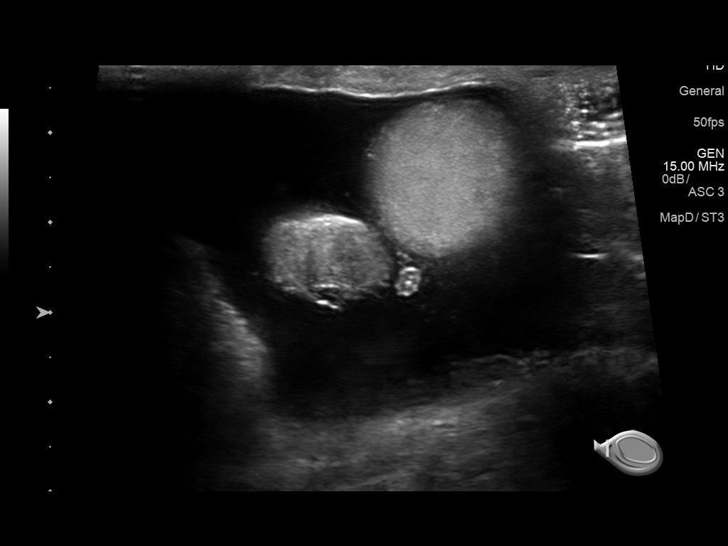
[im 57/57]
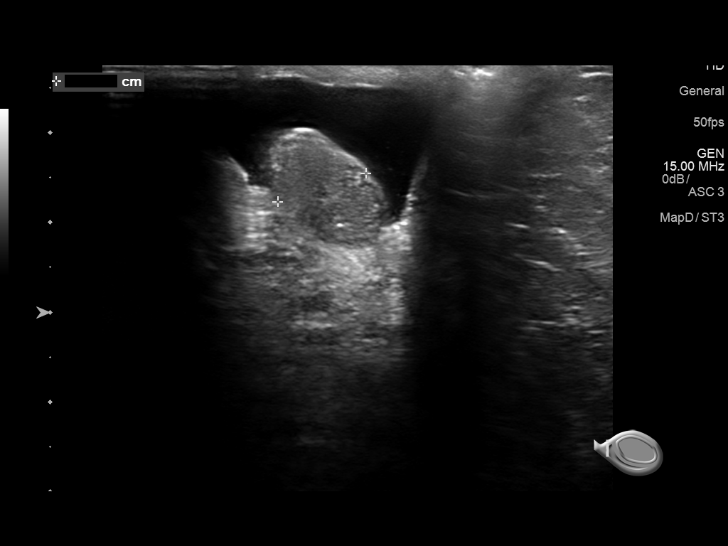

[13 of 25 positions shown; findings below may reference images not displayed]

FINDINGS: Right testicle

Measurements: 4.7 x 3.0 x 3.1 cm. No mass or microlithiasis
visualized.

Left testicle

Measurements: 3.8 x 2.6 x 2.9 cm. No mass or microlithiasis
visualized.

Right epididymis: Normal in size and appearance. No inflammatory
focus evident.

Left epididymis: Normal in size and appearance. No inflammatory
focus evident.

Hydrocele: There are moderate hydroceles bilaterally, larger on the
right than on the left.

Varicocele: With Valsalva maneuver, there is evidence of a
left-sided varicocele.

Pulsed Doppler interrogation of both testes demonstrates normal low
resistance arterial and venous waveforms bilaterally.

No scrotal abscess or scrotal wall thickening.
IMPRESSION: 1.  Bilateral hydroceles, larger on the right than on the left.

2.  Left-sided varicocele with Valsalva maneuver.

3. No intratesticular or extratesticular mass or inflammation on
either side. No testicular torsion evident.

## 2020-10-14 ENCOUNTER — Ambulatory Visit (INDEPENDENT_AMBULATORY_CARE_PROVIDER_SITE_OTHER): Payer: Medicare Other

## 2020-10-14 ENCOUNTER — Ambulatory Visit (INDEPENDENT_AMBULATORY_CARE_PROVIDER_SITE_OTHER): Payer: Medicare Other | Admitting: Podiatrist

## 2020-10-14 ENCOUNTER — Other Ambulatory Visit: Payer: Self-pay

## 2020-10-14 DIAGNOSIS — L84 Corns and callosities: Secondary | ICD-10-CM

## 2020-10-14 DIAGNOSIS — M109 Gout, unspecified: Secondary | ICD-10-CM

## 2020-10-14 MED ORDER — COLCHICINE 0.6 MG PO TABS: 0.6000 mg | ORAL_TABLET | Freq: Every day | ORAL | 2 refills | Status: DC

## 2020-10-14 NOTE — Progress Notes (Signed)
Chief Complaint  Patient presents with   Gout    Right great toe gout flare up x 4 days. Aching throbbing pain associated with redness and edema.    Callouses    Right 3rd to callus. Callus is causing tenderness per pt.      HPI: Patient is 80 y.o. male who presents today for the concerns as listed above.  He relates he had gout in the past stating that he is having a flareup in the right great toe for the past 4 days.  Patient also has a callus on the right third toe which is causing some tenderness especially when walking and in shoes.  Patient Active Problem List   Diagnosis Date Noted   S/P reverse total shoulder arthroplasty, left 01/23/2020   Benign essential hypertension 02/08/2018   Hyperlipidemia with target LDL less than 100 02/08/2018   Diabetic ulcer of toe of left foot associated with diabetes mellitus due to underlying condition, limited to breakdown of skin (HCC) 01/19/2018   Type 2 diabetes mellitus without complications (HCC) 01/19/2018   Knee osteoarthritis 02/18/2014   Palpitations 12/19/2013    Current Outpatient Medications on File Prior to Visit  Medication Sig Dispense Refill   alfuzosin (UROXATRAL) 10 MG 24 hr tablet Take 10 mg by mouth at bedtime as needed (urinary flow issues).      Ascorbic Acid (VITAMIN C PO) Take 1 tablet by mouth daily.     Cholecalciferol (VITAMIN D3 PO) Take 1 capsule by mouth daily.      Cyanocobalamin (B-12 PO) Take 1 lozenge by mouth daily as needed (energy).      cyclobenzaprine (FLEXERIL) 10 MG tablet Take 1 tablet (10 mg total) by mouth 3 (three) times daily as needed for muscle spasms. 30 tablet 1   glipiZIDE (GLUCOTROL) 5 MG tablet Take 5 mg by mouth daily before breakfast.     hydrochlorothiazide (HYDRODIURIL) 12.5 MG tablet Take 12.5 mg by mouth daily.      metFORMIN (GLUCOPHAGE-XR) 500 MG 24 hr tablet Take 1,000 mg by mouth in the morning and at bedtime.   0   Misc Natural Products (TURMERIC CURCUMIN) CAPS Take 1 capsule  by mouth daily.     Multiple Vitamin (MULTIVITAMIN) tablet Take 1 tablet by mouth daily.     Multiple Vitamins-Minerals (ZINC PO) Take 1 tablet by mouth daily.     naproxen sodium (ALEVE) 220 MG tablet Take 220 mg by mouth daily as needed (pain).     ondansetron (ZOFRAN) 4 MG tablet Take 1 tablet (4 mg total) by mouth every 8 (eight) hours as needed for nausea or vomiting. 10 tablet 0   oxyCODONE-acetaminophen (PERCOCET) 5-325 MG tablet Take 1 tablet by mouth every 4 (four) hours as needed (max 6 q). 20 tablet 0   pantoprazole (PROTONIX) 20 MG tablet Take 20 mg by mouth daily.     simvastatin (ZOCOR) 20 MG tablet Take 20 mg by mouth at bedtime.      valsartan (DIOVAN) 160 MG tablet Take 160 mg by mouth daily.     No current facility-administered medications on file prior to visit.    Allergies  Allergen Reactions   Clindamycin     Other reaction(s): GI Upset (intolerance)   Penicillins Hives   Ciprofloxacin Other (See Comments)    "light headed"   Sulfa Antibiotics Other (See Comments)    "light headed"   Tamsulosin Other (See Comments)    dizziness, nausea, and raising blood sugar level  Review of Systems No fevers, chills, nausea, muscle aches, no difficulty breathing, no calf pain, no chest pain or shortness of breath.   Physical Exam  GENERAL APPEARANCE: Alert, conversant. Appropriately groomed. No acute distress.   VASCULAR: Pedal pulses palpable DP and PT bilateral.  Capillary refill time is immediate to all digits,  Proximal to distal cooling it warm to warm.  Digital perfusion adequate.   NEUROLOGIC: sensation is intact to 5.07 monofilament at 5/5 sites bilateral.  Light touch is intact bilateral, vibratory sensation intact bilateral  MUSCULOSKELETAL: acceptable muscle strength, tone and stability bilateral.  No gross boney pedal deformities noted.  Pain with range of motion of the right great toe is noted consistent with gout flare.  DERMATOLOGIC: skin is warm,  supple, and dry.  No open lesions noted.  Hard callus on the right third toe was also noted which post debridement reveals intact integument.  Contracture of the third digit is also noted.  X-ray evaluation 3 views of the right foot are obtained.  Arthritis at the first metatarsal phalangeal joint with inflammation around the joint is seen.  Arthritis at the hallux IP joint is also noted.  Midfoot arthritis is also seen as well.  No sign of fracture or dislocation is noted.  Assessment   1. Corns and callus   2. 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  For the callus on the right third toe I pared it with a #15 blade without complication and recommended padding.  For the gout we discussed an injection versus trying colchicine for the gout flare.  The patient would like to try the oral option first and a prescription for colchicine was written.  If the gout flare continues after 3 to 4 days of using the colchicine he will call.  If any further questions or concerns arise he is instructed to call.

## 2020-10-14 NOTE — Patient Instructions (Signed)
Gout Gout is painful swelling of your joints. Gout is a type of arthritis. It is caused by having too much uric acid in your body. Uric acid is a chemical that is made when your body breaks down substances called purines. If your body has too much uric acid, sharp crystals can form and build up in your joints. This causes pain and swelling. Gout attacks can happen quickly and be very painful (acute gout). Over time, the attacks can affect more joints and happen more often (chronic gout). What are the causes? Too much uric acid in your blood. This can happen because: Your kidneys do not remove enough uric acid from your blood. Your body makes too much uric acid. You eat too many foods that are high in purines. These foods include organ meats, some seafood, and beer. Trauma or stress. What increases the risk? Having a family history of gout. Being male and middle-aged. Being male and having gone through menopause. Being very overweight (obese). Drinking alcohol, especially beer. Not having enough water in the body (being dehydrated). Losing weight too quickly. Having an organ transplant. Having lead poisoning. Taking certain medicines. Having kidney disease. Having a skin condition called psoriasis. What are the signs or symptoms? An attack of acute gout usually happens in just one joint. The most common place is the big toe. Attacks often start at night. Other joints that may be affected include joints of the feet, ankle, knee, fingers, wrist, or elbow. Symptoms of an attack may include: Very bad pain. Warmth. Swelling. Stiffness. Shiny, red, or purple skin. Tenderness. The affected joint may be very painful to touch. Chills and fever. Chronic gout may cause symptoms more often. More joints may be involved. You may also have white or yellow lumps (tophi) on your hands or feet or in other areas near your joints. How is this treated? Treatment for this condition has two phases:  treating an acute attack and preventing future attacks. Acute gout treatment may include: NSAIDs. Steroids. These are taken by mouth or injected into a joint. Colchicine. This medicine relieves pain and swelling. It can be given by mouth or through an IV tube. Preventive treatment may include: Taking small doses of NSAIDs or colchicine daily. Using a medicine that reduces uric acid levels in your blood. Making changes to your diet. You may need to see a food expert (dietitian) about what to eat and drink to prevent gout. Follow these instructions at home: During a gout attack  If told, put ice on the painful area: Put ice in a plastic bag. Place a towel between your skin and the bag. Leave the ice on for 20 minutes, 2-3 times a day. Raise (elevate) the painful joint above the level of your heart as often as you can. Rest the joint as much as possible. If the joint is in your leg, you may be given crutches. Follow instructions from your doctor about what you cannot eat or drink. Avoiding future gout attacks Eat a low-purine diet. Avoid foods and drinks such as: Liver. Kidney. Anchovies. Asparagus. Herring. Mushrooms. Mussels. Beer. Stay at a healthy weight. If you want to lose weight, talk with your doctor. Do not lose weight too fast. Start or continue an exercise plan as told by your doctor. Eating and drinking Drink enough fluids to keep your pee (urine) pale yellow. If you drink alcohol: Limit how much you use to: 0-1 drink a day for women. 0-2 drinks a day for men. Be aware of   how much alcohol is in your drink. In the [place] one drink equals one 12 oz bottle of beer (355 mL), one 5 oz glass of wine (148 mL), or one 1 oz glass of hard liquor (44 mL). General instructions Take over-the-counter and prescription medicines only as told by your doctor. Do not drive or use heavy machinery while taking prescription pain medicine. Return to your normal activities as told by your  doctor. Ask your doctor what activities are safe for you. Keep all follow-up visits as told by your doctor. This is important. Contact a doctor if: You have another gout attack. You still have symptoms of a gout attack after 10 days of treatment. You have problems (side effects) because of your medicines. You have chills or a fever. You have burning pain when you pee (urinate). You have pain in your lower back or belly. Get help right away if: You have very bad pain. Your pain cannot be controlled. You cannot pee. Summary Gout is painful swelling of the joints. The most common site of pain is the big toe, but it can affect other joints. Medicines and avoiding some foods can help to prevent and treat gout attacks. This information is not intended to replace advice given to you by your health care provider. Make sure you discuss any questions you have with your health care provider. Document Revised: 10/18/2017 Document Reviewed: 10/18/2017 Elsevier Patient Education  2022 [employer]  

## 2020-10-18 ENCOUNTER — Other Ambulatory Visit: Payer: Self-pay | Admitting: Podiatrist

## 2020-10-20 NOTE — Telephone Encounter (Signed)
Please advise 

## 2020-10-21 ENCOUNTER — Encounter: Payer: Self-pay | Admitting: Podiatrist

## 2020-10-22 NOTE — Telephone Encounter (Signed)
Refilled for 30 tabs x 2 refills.

## 2020-11-09 ENCOUNTER — Ambulatory Visit (INDEPENDENT_AMBULATORY_CARE_PROVIDER_SITE_OTHER): Payer: Medicare Other | Admitting: Podiatry

## 2020-11-09 ENCOUNTER — Other Ambulatory Visit: Payer: Self-pay

## 2020-11-09 ENCOUNTER — Encounter: Payer: Self-pay | Admitting: Podiatry

## 2020-11-09 DIAGNOSIS — L84 Corns and callosities: Secondary | ICD-10-CM

## 2020-11-09 DIAGNOSIS — M109 Gout, unspecified: Secondary | ICD-10-CM | POA: Diagnosis not present

## 2020-11-09 DIAGNOSIS — M2041 Other hammer toe(s) (acquired), right foot: Secondary | ICD-10-CM

## 2020-11-09 DIAGNOSIS — M2042 Other hammer toe(s) (acquired), left foot: Secondary | ICD-10-CM | POA: Diagnosis not present

## 2020-11-11 NOTE — Progress Notes (Signed)
Subjective: 80 year old male presents the office today for follow-up evaluation Of the right foot.  States he been doing much better and the pain is resolved.  He did see Dr. Irving Shows for this issue on July 16 he took colchicine which did resolve her symptoms.  His main concern otherwise today is a callus, discomfort of the tip of the right third toe.  The toe Does help wear different shoes as well. Denies any systemic complaints such as fevers, chills, nausea, vomiting. No acute changes since last appointment, and no other complaints at this time.   Objective: AAO x3, NAD DP/PT pulses palpable bilaterally, CRT less than 3 seconds Minimal edema to the dorsal aspect of right foot there is no erythema or warmth.  Flexor, extensor tendons appear to be intact.  There is no evidence of gout today.  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  Upon debridement there is no underlying ulceration drainage or any signs of infection.  No open lesions bilaterally. No open lesions or pre-ulcerative lesions.  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  -Gout is resolved.  He gets very infrequent gout flares.  Discussed there is any increased frequency discussed medications to help with this.  Discussed diet, hydration. -Debrided hyperkeratotic lesions any complications.  Dispensed offloading pads.  Discussed modifications.  Daily foot inspection and monitor for any skin breakdown or signs of infection. -Patient encouraged to call the office with any questions, concerns, change in symptoms.   Vivi Barrack DPM

## 2020-11-12 ENCOUNTER — Ambulatory Visit: Payer: Medicare Other | Admitting: Podiatry

## 2020-11-17 ENCOUNTER — Ambulatory Visit: Payer: Medicare Other | Admitting: Podiatry

## 2021-01-05 ENCOUNTER — Encounter: Payer: Self-pay | Admitting: Podiatry

## 2021-01-05 ENCOUNTER — Ambulatory Visit (INDEPENDENT_AMBULATORY_CARE_PROVIDER_SITE_OTHER): Payer: Medicare Other | Admitting: Podiatry

## 2021-01-05 ENCOUNTER — Ambulatory Visit: Payer: Medicare Other | Admitting: Podiatry

## 2021-01-05 ENCOUNTER — Other Ambulatory Visit: Payer: Self-pay

## 2021-01-05 DIAGNOSIS — M79674 Pain in right toe(s): Secondary | ICD-10-CM | POA: Diagnosis not present

## 2021-01-05 DIAGNOSIS — E119 Type 2 diabetes mellitus without complications: Secondary | ICD-10-CM | POA: Diagnosis not present

## 2021-01-05 DIAGNOSIS — B351 Tinea unguium: Secondary | ICD-10-CM

## 2021-01-05 DIAGNOSIS — L84 Corns and callosities: Secondary | ICD-10-CM

## 2021-01-05 DIAGNOSIS — M79675 Pain in left toe(s): Secondary | ICD-10-CM

## 2021-01-08 NOTE — Progress Notes (Signed)
Subjective: 81 y.o. returns the office today for painful, elongated, thickened toenails which he  cannot trim himself and also for a callus on the right 3rd toe. He has not had any swelling, redness, drainage. No open sores he reports. No fevers or chills.  No recent gout flares.  PCP: Joaquin Courts, DO   Objective: AAO 3, NAD DP/PT pulses palpable, CRT less than 3 seconds Nails hypertrophic, dystrophic, elongated, brittle, discolored 10. There is tenderness overlying the nails 1-5 bilaterally. There is no surrounding erythema or drainage along the nail sites. Hyperkeratotic lesion distal aspect right third toe.  No underlying ulceration drainage or any signs of infection.  There was some dried blood present under the callus prior to debridement.  No bleeding today. Hammertoes present. No pain with calf compression, swelling, warmth, erythema.  Assessment: Symptomatic onychomycosis, hyperkeratotic lesion right third toe  Plan: -Treatment options including alternatives, risks, complications were discussed -Nails sharply debrided 10 without complication/bleeding. -Hyperkeratotic lesion sharply debrided x1 without any complications or bleeding.  Offloading. -Discussed daily foot inspection. If there are any changes, to call the office immediately.  -Follow-up as scheduled or sooner if any problems are to arise. In the meantime, encouraged to call the office with any questions, concerns, changes symptoms.  Ovid Curd, DPM

## 2021-04-06 ENCOUNTER — Ambulatory Visit (INDEPENDENT_AMBULATORY_CARE_PROVIDER_SITE_OTHER): Payer: Medicare Other | Admitting: Podiatry

## 2021-04-06 ENCOUNTER — Other Ambulatory Visit: Payer: Self-pay

## 2021-04-06 DIAGNOSIS — B351 Tinea unguium: Secondary | ICD-10-CM

## 2021-04-06 DIAGNOSIS — M79674 Pain in right toe(s): Secondary | ICD-10-CM | POA: Diagnosis not present

## 2021-04-06 DIAGNOSIS — L989 Disorder of the skin and subcutaneous tissue, unspecified: Secondary | ICD-10-CM | POA: Diagnosis not present

## 2021-04-06 DIAGNOSIS — L84 Corns and callosities: Secondary | ICD-10-CM

## 2021-04-06 DIAGNOSIS — M79675 Pain in left toe(s): Secondary | ICD-10-CM | POA: Diagnosis not present

## 2021-04-06 DIAGNOSIS — E119 Type 2 diabetes mellitus without complications: Secondary | ICD-10-CM

## 2021-04-08 NOTE — Progress Notes (Signed)
Subjective: 80 y.o. returns the office today for painful, elongated, thickened toenails which he  cannot trim himself and also for a callus on the right 3rd toe.  Is the calluses been causing quite a bit of discomfort.  Denies any swelling or redness or any drainage.  He is also noticed a lesion on his left fifth toe but not causing significant pain.  No open sore to this area.  He currently has no fevers or chills.   Last A1c was 6 and last glucose was 174.   PCP: Joaquin Courts, DO   Objective: AAO 3, NAD DP/PT pulses palpable, CRT less than 3 seconds Nails hypertrophic, dystrophic, elongated, brittle, discolored 10. There is tenderness overlying the nails 1-5 bilaterally. There is no surrounding erythema or drainage along the nail sites. Hyperkeratotic lesion distal aspect right third toe.  No underlying ulceration drainage or any signs of infection.  There was some dried blood present under the callus prior to debridement.  No bleeding today. New lesion present on the lateral aspect of the fourth toe without any ulceration underneath it.  On the medial fifth toe adjacent is what appears to be a preulcerative area but a flat hemorrhagic blister but there is no fluid to drain today.  There is no definitive open sore.  No significant callus to debride overlying this.  No edema, erythema or signs of infection. Hammertoes present. No pain with calf compression, swelling, warmth, erythema.  Assessment: Symptomatic onychomycosis, hyperkeratotic lesion right third toe; preulcerative lesion left fifth toe  Plan: -Treatment options including alternatives, risks, complications were discussed -Nails sharply debrided 10 without complication/bleeding. -Hyperkeratotic lesion sharply debrided x1 without any complications or bleeding.  Offloading. -For left fifth toe very closely.  Recommended offloading and offloading pads were dispensed.  I will keep a Band-Aid on the area as well for  additional protection. -Monitor for any clinical signs or symptoms of infection and directed to call the office immediately should any occur or go to the ER. -Follow-up as scheduled or sooner if any problems are to arise. In the meantime, encouraged to call the office with any questions, concerns, changes symptoms.  Return in about 4 weeks (around 05/04/2021).  Ovid Curd, DPM

## 2021-05-11 ENCOUNTER — Ambulatory Visit (INDEPENDENT_AMBULATORY_CARE_PROVIDER_SITE_OTHER): Payer: Medicare Other | Admitting: Podiatry

## 2021-05-11 ENCOUNTER — Other Ambulatory Visit: Payer: Self-pay

## 2021-05-11 DIAGNOSIS — L84 Corns and callosities: Secondary | ICD-10-CM

## 2021-05-11 DIAGNOSIS — E119 Type 2 diabetes mellitus without complications: Secondary | ICD-10-CM

## 2021-05-16 NOTE — Progress Notes (Signed)
Subjective: 81 year old male presents clinic today for evaluation of left fifth toe ulcer.  Overall he states that he is doing well and he has no new concerns today.  No drainage or pus.  No fevers or chills.  No chest pain or shortness of breath.  Objective: AAO x3, NAD DP/PT pulses palpable bilaterally, CRT less than 3 seconds Hyperkeratotic tissue present on the fourth and fifth toe.  There was some dried blood present underneath it but upon debridement as able to remove the majority this and there is no underlying ulceration drainage or any signs of infection noted today.  There are no new lesions or open lesions noted today. No pain with calf compression, swelling, warmth, erythema  Assessment: Preulcerative lesion left foot, fourth, fifth digit  Plan: -All treatment options discussed with the patient including all alternatives, risks, complications.  -Sharply debrided the hyperkeratotic preulcerative lesion with any complications or bleeding.  Continue offloading and monitor closely for any signs or symptoms of infection or reoccurrence.  As a courtesy I did debride the nails with any complications or bleeding today but they were not significantly elongated as they were just done last month. -Patient encouraged to call the office with any questions, concerns, change in symptoms.   Return in about 9 weeks (around 07/13/2021).  Vivi Barrack DPM

## 2021-07-13 ENCOUNTER — Ambulatory Visit (INDEPENDENT_AMBULATORY_CARE_PROVIDER_SITE_OTHER): Payer: Medicare Other | Admitting: Podiatry

## 2021-07-13 DIAGNOSIS — L989 Disorder of the skin and subcutaneous tissue, unspecified: Secondary | ICD-10-CM

## 2021-07-13 DIAGNOSIS — L84 Corns and callosities: Secondary | ICD-10-CM

## 2021-07-13 DIAGNOSIS — B351 Tinea unguium: Secondary | ICD-10-CM

## 2021-07-13 DIAGNOSIS — E119 Type 2 diabetes mellitus without complications: Secondary | ICD-10-CM

## 2021-07-16 NOTE — Progress Notes (Signed)
Subjective: ?81 year old male presents clinic today for evaluation of a lesion on the left fifth toe.  He states that the lesion has come back causing discomfort.  Not see any swelling or redness or any drainage.  Area is tender with pressure.  Denies any fevers or chills. ? ?Objective: ?AAO x3, NAD ?DP/PT pulses palpable bilaterally, CRT less than 3 seconds ?Hyperkeratotic tissue present on the medial aspect of the fifth toe with some dried blood underneath.  Upon debridement the area is preulcerative but there is no definitive skin breakdown.  No drainage or pus.  No fluctuance or crepitation but there is no malodor.  Adductovarus present of the fifth toe.  Nails are mildly hypertrophic, dystrophic and elongated.  No edema, erythema to the toenail sites. ?No pain with calf compression, swelling, warmth, erythema ? ?Assessment: ?Preulcerative lesion left fifth toe ? ?Plan: ?-All treatment options discussed with the patient including all alternatives, risks, complications.  ?-Sharply debrided the hyperkeratotic preulcerative lesion with any complications or bleeding.  Continue offloading.  Recommended a small amount of antibiotic ointment and a bandage daily as well.  Monitor closely for signs or symptoms of infection or skin breakdown.  ?-As a courtesy debrided the nails without any complications or bleeding ? ? ?Vivi Barrack DPM ? ?

## 2021-10-18 ENCOUNTER — Ambulatory Visit: Payer: Medicare Other | Admitting: Podiatry

## 2021-12-07 ENCOUNTER — Ambulatory Visit (INDEPENDENT_AMBULATORY_CARE_PROVIDER_SITE_OTHER): Payer: Medicare Other | Admitting: Podiatry

## 2021-12-07 DIAGNOSIS — M79675 Pain in left toe(s): Secondary | ICD-10-CM

## 2021-12-07 DIAGNOSIS — M79674 Pain in right toe(s): Secondary | ICD-10-CM | POA: Diagnosis not present

## 2021-12-07 DIAGNOSIS — L84 Corns and callosities: Secondary | ICD-10-CM

## 2021-12-07 DIAGNOSIS — E119 Type 2 diabetes mellitus without complications: Secondary | ICD-10-CM | POA: Diagnosis not present

## 2021-12-07 DIAGNOSIS — B351 Tinea unguium: Secondary | ICD-10-CM

## 2021-12-07 NOTE — Progress Notes (Signed)
Subjective: 81 year old male presents clinic today for  thick, elongated toenails he is unable to trim himself as well as for painful calluses most of the right foot submetatarsal 1.  Denies any ulcerations.  No swelling redness or any drainage.  No open lesions that he reports.  No other concerns.    Objective: AAO x3, NAD DP/PT pulses palpable bilaterally, CRT less than 3 seconds Nails are hypertrophic, dystrophic, brittle, discolored, elongated 10. No surrounding redness or drainage. Tenderness nails 1-5 bilaterally. No open lesions or pre-ulcerative lesions are identified today. Hyperkeratotic lesion right foot submetatarsal 1 as well as distal left third toe and lateral left fourth toe.  There is no ongoing ulceration drainage or signs of infection.  There is no open lesions. No pain with calf compression, swelling, warmth, erythema  Assessment: Symptomatic onychomycosis, preulcerative calluses  Plan: -All treatment options discussed with the patient including all alternatives, risks, complications.  -Sharply debrided the nails x10 without any complications or bleeding. -Sharply debrided the hyperkeratotic lesions x3 without any complications or bleeding.  Recommend moisturizer, offloading.  Do not apply any moisturizer interdigitally.  Return in about 3 months (around 03/09/2022).  Vivi Barrack DPM

## 2022-03-08 ENCOUNTER — Ambulatory Visit: Payer: Medicare Other | Admitting: Podiatry

## 2022-03-31 ENCOUNTER — Ambulatory Visit: Payer: Medicare Other | Admitting: Podiatry

## 2022-04-29 ENCOUNTER — Ambulatory Visit (INDEPENDENT_AMBULATORY_CARE_PROVIDER_SITE_OTHER): Payer: Medicare Other | Admitting: Podiatry

## 2022-04-29 DIAGNOSIS — L84 Corns and callosities: Secondary | ICD-10-CM | POA: Diagnosis not present

## 2022-04-29 DIAGNOSIS — M79674 Pain in right toe(s): Secondary | ICD-10-CM | POA: Diagnosis not present

## 2022-04-29 DIAGNOSIS — M79675 Pain in left toe(s): Secondary | ICD-10-CM | POA: Diagnosis not present

## 2022-04-29 DIAGNOSIS — E119 Type 2 diabetes mellitus without complications: Secondary | ICD-10-CM | POA: Diagnosis not present

## 2022-04-29 DIAGNOSIS — B351 Tinea unguium: Secondary | ICD-10-CM

## 2022-05-08 NOTE — Progress Notes (Signed)
Subjective: Chief Complaint  Patient presents with   foot care     Routine diabetic foot care    82 year old male presents with the above concerns.  He has thick, elongated toenails he is unable to trim himself as well as for painful calluses most of the right foot submetatarsal 1.  Denies any ulcerations.  No swelling redness or any drainage.  He has been keeping toe separators as well as cushions on to help with this.  Objective: AAO x3, NAD DP/PT pulses palpable bilaterally, CRT less than 3 seconds Nails are hypertrophic, dystrophic, brittle, discolored, elongated 10. No surrounding redness or drainage. Tenderness nails 1-5 bilaterally. No open lesions or pre-ulcerative lesions are identified today. Hyperkeratotic lesion right foot submetatarsal 1 as well as distal left third toe and lateral left fourth toe.  There is no ongoing ulceration drainage or signs of infection.  There is no open lesions. Digital deformity noted to the digits bilaterally No pain with calf compression, swelling, warmth, erythema  Assessment: Symptomatic onychomycosis, preulcerative calluses  Plan: -All treatment options discussed with the patient including all alternatives, risks, complications.  -Discussed the conservative as well as surgical options given that there is a deformity to his toes.  Wants to continue conservative care. -Sharply debrided the nails x10 without any complications or bleeding. -Sharply debrided the hyperkeratotic lesions x3 without any complications or bleeding.  Recommend moisturizer, offloading.  Do not apply any moisturizer interdigitally. -Continue with offloading pads, toe separators.  Return in about 3 months (around 07/29/2022).  Trula Slade DPM

## 2022-06-09 ENCOUNTER — Ambulatory Visit (INDEPENDENT_AMBULATORY_CARE_PROVIDER_SITE_OTHER): Payer: Medicare Other | Admitting: Podiatry

## 2022-06-09 DIAGNOSIS — M109 Gout, unspecified: Secondary | ICD-10-CM

## 2022-06-09 DIAGNOSIS — L02611 Cutaneous abscess of right foot: Secondary | ICD-10-CM | POA: Diagnosis not present

## 2022-06-09 DIAGNOSIS — L03031 Cellulitis of right toe: Secondary | ICD-10-CM | POA: Diagnosis not present

## 2022-06-09 DIAGNOSIS — L84 Corns and callosities: Secondary | ICD-10-CM

## 2022-06-09 MED ORDER — DOXYCYCLINE HYCLATE 100 MG PO CAPS: 100.0000 mg | ORAL_CAPSULE | Freq: Two times a day (BID) | ORAL | 0 refills | Status: AC

## 2022-06-09 NOTE — Progress Notes (Signed)
  Subjective:  Patient ID: Laurence Aly, male    DOB: Nov 03, 1940,  MRN: [id number]  Chief Complaint  Patient presents with   Nail Problem    Infected 3rd R toe. Diabeic    82 y.o. male presents with the above complaint. History confirmed with patient.  His diabetes is well-controlled.  He thinks he may have developed an infection in the toe after having a pedicure recently  Objective:  Physical Exam: warm, good capillary refill, no trophic changes or ulcerative lesions, normal DP and PT pulses, normal sensory exam, and right third toe shows preulcerative callus at tip, erythema around the DIPJ and tenderness, no open ulceration drainage or ingrown nail noted   Assessment:   1. 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 Pre-ulcerative calluses   3. Cellulitis and abscess of toe of right foot      Plan:  Patient was evaluated and treated and all questions answered.  He has pain and erythema on the third toe there is no obvious ulceration or ingrown nail.  Possible this is cellulitis and empirically recommend we treat him with doxycycline.  Also possibility this is a gouty flare in the DIPJ.  Rx for doxycycline sent to pharmacy.  Order for [facility] to check his uric acid and CBC was given.  I will let him know what the lab results show and he will return for follow-up in 3 to 4 weeks.  If uric acid elevated would recommend DC of doxycycline and we will treat with colchicine.  Signs and symptoms of worsening infection reviewed and he will let us know if this develops  No follow-ups on file.

## 2022-06-10 ENCOUNTER — Telehealth: Payer: Self-pay

## 2022-06-10 ENCOUNTER — Encounter: Payer: Self-pay | Admitting: Podiatry

## 2022-06-10 LAB — CBC WITH DIFFERENTIAL/PLATELET
Basophils Absolute: 0 10*3/uL (ref 0.0–0.2)
Basos: 0 %
EOS (ABSOLUTE): 0 10*3/uL (ref 0.0–0.4)
Eos: 0 %
Hematocrit: 42.1 % (ref 37.5–51.0)
Hemoglobin: 14.4 g/dL (ref 13.0–17.7)
Immature Grans (Abs): 0 10*3/uL (ref 0.0–0.1)
Immature Granulocytes: 0 %
Lymphocytes Absolute: 1.7 10*3/uL (ref 0.7–3.1)
Lymphs: 10 %
MCH: 32.7 pg (ref 26.6–33.0)
MCHC: 34.2 g/dL (ref 31.5–35.7)
MCV: 96 fL (ref 79–97)
Monocytes Absolute: 1 10*3/uL — ABNORMAL HIGH (ref 0.1–0.9)
Monocytes: 6 %
Neutrophils Absolute: 15 10*3/uL — ABNORMAL HIGH (ref 1.4–7.0)
Neutrophils: 84 %
Platelets: 220 10*3/uL (ref 150–450)
RBC: 4.4 x10E6/uL (ref 4.14–5.80)
RDW: 13.1 % (ref 11.6–15.4)
WBC: 17.8 10*3/uL — ABNORMAL HIGH (ref 3.4–10.8)

## 2022-06-10 LAB — URIC ACID: Uric Acid: 5.9 mg/dL (ref 3.8–8.4)

## 2022-06-10 NOTE — Telephone Encounter (Signed)
Noted, thanks!

## 2022-06-12 ENCOUNTER — Encounter: Payer: Self-pay | Admitting: Podiatry

## 2022-06-13 MED ORDER — COLCHICINE 0.6 MG PO TABS: ORAL_TABLET | ORAL | 2 refills | Status: DC

## 2022-06-30 ENCOUNTER — Ambulatory Visit (INDEPENDENT_AMBULATORY_CARE_PROVIDER_SITE_OTHER): Payer: Medicare Other | Admitting: Podiatry

## 2022-06-30 DIAGNOSIS — M2041 Other hammer toe(s) (acquired), right foot: Secondary | ICD-10-CM | POA: Diagnosis not present

## 2022-06-30 DIAGNOSIS — M109 Gout, unspecified: Secondary | ICD-10-CM

## 2022-06-30 MED ORDER — COLCHICINE 0.6 MG PO TABS: ORAL_TABLET | ORAL | 2 refills | Status: AC

## 2022-07-03 NOTE — Progress Notes (Signed)
  Subjective:  Patient ID: Gabriel Mcpherson, male    DOB: [birth year],  MRN: [id number]  Chief Complaint  Patient presents with   Gout    3rd toe infection 3 wk f/u    82 y.o. male presents with the above complaint. History confirmed with patient.  He completed the lab work, had improvement with colchicine  Objective:  Physical Exam: warm, good capillary refill, no trophic changes or ulcerative lesions, normal DP and PT pulses, normal sensory exam, and right third toe shows preulcerative callus at tip, no erythema today he does have some edema in the PIPJ and pain    Uric acid 5.9  Assessment:   1. 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 Hammertoe of right foot      Plan:  Patient was evaluated and treated and all questions answered.  Has had improvement.  Colchicine helped quite a bit.  We discussed further treatment with prednisone.  He would prefer to do another of the round of colchicine, he will let me know if this does not improve with prednisone Rx.  Also discussed the preulcerative callus coming from his hammertoe deformity discussed the option of correction including flexor tenotomy if not improving.  Return in about 4 weeks (around 07/28/2022) for follow up hammertoe right foot (possible tenotomy).

## 2022-07-27 ENCOUNTER — Ambulatory Visit (INDEPENDENT_AMBULATORY_CARE_PROVIDER_SITE_OTHER): Payer: Medicare Other

## 2022-07-27 ENCOUNTER — Ambulatory Visit (INDEPENDENT_AMBULATORY_CARE_PROVIDER_SITE_OTHER): Payer: Medicare Other | Admitting: Podiatry

## 2022-07-27 DIAGNOSIS — M2041 Other hammer toe(s) (acquired), right foot: Secondary | ICD-10-CM

## 2022-07-27 NOTE — Patient Instructions (Signed)
Look for urea 40% cream or ointment and apply to the thickened dry skin / calluses. This can be bought over the counter, at a pharmacy or online such as [employer].  

## 2022-07-30 NOTE — Progress Notes (Signed)
  Subjective:  Patient ID: Gabriel Mcpherson, male    DOB: 20-Apr-1940,  MRN: [id number]  Chief Complaint  Patient presents with   Hammer Toe    hammertoe right foot    82 y.o. male presents with the above complaint. History confirmed with patient.  He says he has tenderness on the toe still that is intermittent  Objective:  Physical Exam: warm, good capillary refill, no trophic changes or ulcerative lesions, normal DP and PT pulses, normal sensory exam, and right third toe shows preulcerative callus at tip, today there is no erythema or pain directly to palpation in the joint  Uric acid 5.9  Radiographs taken today show no distal erosions at the distal phalanx or fracture.  There are some arthritic changes in the lesser interphalangeal joints  Assessment:   1. Hammertoe of right foot      Plan:  Patient was evaluated and treated and all questions answered.  We again reviewed his progress.  Most of his pain now is related to the hammertoe, do not think he has active gout or cellulitis.  Callus was debrided as a courtesy.  Recommended utilizing urea cream and offloading silicone pads.  Otherwise looking at surgical options.  Return as needed  Return if symptoms worsen or fail to improve.

## 2022-08-05 ENCOUNTER — Ambulatory Visit: Payer: Medicare Other | Admitting: Podiatry

## 2022-10-07 ENCOUNTER — Ambulatory Visit (INDEPENDENT_AMBULATORY_CARE_PROVIDER_SITE_OTHER): Payer: Medicare Other | Admitting: Podiatry

## 2022-10-07 ENCOUNTER — Encounter: Payer: Self-pay | Admitting: Podiatry

## 2022-10-07 DIAGNOSIS — E119 Type 2 diabetes mellitus without complications: Secondary | ICD-10-CM

## 2022-10-07 DIAGNOSIS — M79675 Pain in left toe(s): Secondary | ICD-10-CM

## 2022-10-07 DIAGNOSIS — L989 Disorder of the skin and subcutaneous tissue, unspecified: Secondary | ICD-10-CM

## 2022-10-07 DIAGNOSIS — M79674 Pain in right toe(s): Secondary | ICD-10-CM

## 2022-10-07 DIAGNOSIS — M2041 Other hammer toe(s) (acquired), right foot: Secondary | ICD-10-CM

## 2022-10-07 NOTE — Progress Notes (Signed)
This patient returns to my office for at risk foot care.  This patient requires this care by a professional since this patient will be at risk due to having diabetes.  This patient is unable to cut nails himself since the patient cannot reach his nails.These nails are painful walking and wearing shoes. He also has painful corn on the tip of his third toe right foot. The patient presents with his wife. This patient presents for at risk foot care today.  General Appearance  Alert, conversant and in no acute stress.  Vascular  Dorsalis pedis and posterior tibial  pulses are palpable  bilaterally.  Capillary return is within normal limits  bilaterally. Temperature is within normal limits  bilaterally.  Neurologic  Senn-Weinstein monofilament wire test within normal limits  bilaterally. Muscle power within normal limits bilaterally.  Nails Thick disfigured discolored nails with subungual debris  from hallux to fifth toes bilaterally. No evidence of bacterial infection or drainage bilaterally.  Orthopedic  No limitations of motion  feet .  No crepitus or effusions noted.  Hammer toe  B/L.  Skin  normotropic skin with no porokeratosis noted bilaterally.  No signs of infections or ulcers noted.   Distal clavi third toe right foot.  Onychomycosis  Pain in right toes  Pain in left toes  Clavi secondary hammer toe third right.  Consent was obtained for treatment procedures.   Mechanical debridement of nails 1-5  bilaterally performed with a nail nipper.  Filed with dremel without incident.    Return office visit    3 months                 Told patient to return for periodic foot care and evaluation due to potential at risk complications. Padding was dispensed.   Helane Gunther DPM  .

## 2022-12-14 ENCOUNTER — Ambulatory Visit: Payer: Medicare Other | Admitting: Podiatry

## 2022-12-14 ENCOUNTER — Encounter: Payer: Self-pay | Admitting: Podiatry

## 2022-12-14 DIAGNOSIS — M7752 Other enthesopathy of left foot: Secondary | ICD-10-CM

## 2022-12-14 DIAGNOSIS — D689 Coagulation defect, unspecified: Secondary | ICD-10-CM

## 2022-12-14 DIAGNOSIS — L84 Corns and callosities: Secondary | ICD-10-CM

## 2022-12-14 DIAGNOSIS — E1149 Type 2 diabetes mellitus with other diabetic neurological complication: Secondary | ICD-10-CM

## 2022-12-14 DIAGNOSIS — E1140 Type 2 diabetes mellitus with diabetic neuropathy, unspecified: Secondary | ICD-10-CM

## 2022-12-15 NOTE — Progress Notes (Signed)
Subjective:   Patient ID: Gabriel Mcpherson, male   DOB: 82 y.o.   MRN: [id number]   HPI Patient presents with very painful lesion at the end of the third toe right and underneath the right and left foot and also has a painful lesion fifth digit left with fluid buildup   ROS      Objective:  Physical Exam  Inflammatory capsulitis of the fifth digit left interphalangeal joint fluid buildup with severe lesion formation x 3 with patient on blood thinner     Assessment:  High risk patient with keratotic lesion chronic and also inflammatory capsulitis left      Plan:  H&P reviewed at this point I went ahead did careful in her phalangeal joint digit 5 left 2 mg Dexasone Kenalog 2 mg Xylocaine debrided 3 separate lesions no iatrogenic bleeding and advised on wider shoes and dispensed a crest pad.  Reappoint as symptoms indicate may require more aggressive approach

## 2023-01-09 ENCOUNTER — Ambulatory Visit: Payer: Medicare Other | Admitting: Podiatry

## 2023-02-03 ENCOUNTER — Encounter: Payer: Self-pay | Admitting: Podiatry

## 2023-02-03 ENCOUNTER — Ambulatory Visit (INDEPENDENT_AMBULATORY_CARE_PROVIDER_SITE_OTHER): Payer: Medicare Other | Admitting: Podiatry

## 2023-02-03 DIAGNOSIS — M79674 Pain in right toe(s): Secondary | ICD-10-CM | POA: Diagnosis not present

## 2023-02-03 DIAGNOSIS — M79675 Pain in left toe(s): Secondary | ICD-10-CM

## 2023-02-03 DIAGNOSIS — L84 Corns and callosities: Secondary | ICD-10-CM | POA: Diagnosis not present

## 2023-02-03 DIAGNOSIS — D689 Coagulation defect, unspecified: Secondary | ICD-10-CM

## 2023-02-03 DIAGNOSIS — E119 Type 2 diabetes mellitus without complications: Secondary | ICD-10-CM

## 2023-02-03 NOTE — Progress Notes (Signed)
This patient returns to my office for at risk foot care.  This patient requires this care by a professional since this patient will be at risk due to having diabetes.  This patient is unable to cut nails himself since the patient cannot reach his nails.These nails are painful walking and wearing shoes. He also has painful corn on the tip of his third toe right foot. The patient presents with his wife. This patient presents for at risk foot care today.  General Appearance  Alert, conversant and in no acute stress.  Vascular  Dorsalis pedis and posterior tibial  pulses are palpable  bilaterally.  Capillary return is within normal limits  bilaterally. Temperature is within normal limits  bilaterally.  Neurologic  Senn-Weinstein monofilament wire test within normal limits  bilaterally. Muscle power within normal limits bilaterally.  Nails Thick disfigured discolored nails with subungual debris  from hallux to fifth toes bilaterally. No evidence of bacterial infection or drainage bilaterally.  Orthopedic  No limitations of motion  feet .  No crepitus or effusions noted.  Hammer toe  B/L.  Skin  normotropic skin with no porokeratosis noted bilaterally.  No signs of infections or ulcers noted.   Distal clavi third toe right foot.  Onychomycosis  Pain in right toes  Pain in left toes  Clavi secondary hammer toe third right.  Consent was obtained for treatment procedures.   Mechanical debridement of nails 1-5  bilaterally performed with a nail nipper.  Filed with dremel without incident.    Return office visit    3 months                 Told patient to return for periodic foot care and evaluation due to potential at risk complications. Padding was dispensed.   Helane Gunther DPM  .

## 2023-05-08 ENCOUNTER — Ambulatory Visit: Payer: Medicare Other | Admitting: Podiatry

## 2023-05-08 ENCOUNTER — Encounter: Payer: Self-pay | Admitting: Podiatry

## 2023-05-08 DIAGNOSIS — E119 Type 2 diabetes mellitus without complications: Secondary | ICD-10-CM | POA: Diagnosis not present

## 2023-05-08 DIAGNOSIS — B351 Tinea unguium: Secondary | ICD-10-CM | POA: Diagnosis not present

## 2023-05-08 DIAGNOSIS — M79674 Pain in right toe(s): Secondary | ICD-10-CM | POA: Diagnosis not present

## 2023-05-08 DIAGNOSIS — L84 Corns and callosities: Secondary | ICD-10-CM | POA: Diagnosis not present

## 2023-05-08 DIAGNOSIS — M79675 Pain in left toe(s): Secondary | ICD-10-CM

## 2023-05-08 NOTE — Progress Notes (Signed)
This patient returns to my office for at risk foot care.  This patient requires this care by a professional since this patient will be at risk due to having diabetes.  This patient is unable to cut nails himself since the patient cannot reach his nails.These nails are painful walking and wearing shoes. He also has painful corn on the tip of his third toe right foot. The patient presents with his wife. This patient presents for at risk foot care today.  General Appearance  Alert, conversant and in no acute stress.  Vascular  Dorsalis pedis and posterior tibial  pulses are palpable  bilaterally.  Capillary return is within normal limits  bilaterally. Temperature is within normal limits  bilaterally.  Neurologic  Senn-Weinstein monofilament wire test within normal limits  bilaterally. Muscle power within normal limits bilaterally.  Nails Thick disfigured discolored nails with subungual debris  from hallux to fifth toes bilaterally. No evidence of bacterial infection or drainage bilaterally.  Orthopedic  No limitations of motion  feet .  No crepitus or effusions noted.  Hammer toe  B/L.  Skin  normotropic skin with no porokeratosis noted bilaterally.  No signs of infections or ulcers noted.   Distal clavi third toe right foot.  Onychomycosis  Pain in right toes  Pain in left toes  Clavi secondary hammer toe third right. Corn fifth toe left foot.    Consent was obtained for treatment procedures.   Mechanical debridement of nails 1-5  bilaterally performed with a nail nipper.  Filed with dremel without incident. Debridement of corn 5 left and 3 right.   Return office visit    3 months                 Told patient to return for periodic foot care and evaluation due to potential at risk complications. Padding was dispensed.Debride corn with # 15 blade.  Patient is interested in flexor tenotomy third toe right foot.  To see Dr.  Allena Katz .   Helane Gunther DPM  .

## 2023-06-09 ENCOUNTER — Ambulatory Visit: Payer: Medicare Other | Admitting: Podiatry

## 2023-06-28 ENCOUNTER — Ambulatory Visit: Payer: Medicare Other | Admitting: Podiatry

## 2023-07-11 ENCOUNTER — Encounter (HOSPITAL_BASED_OUTPATIENT_CLINIC_OR_DEPARTMENT_OTHER): Payer: Self-pay | Admitting: Orthopaedic Surgery

## 2023-07-18 ENCOUNTER — Ambulatory Visit: Admitting: Podiatry

## 2023-07-19 ENCOUNTER — Encounter (HOSPITAL_BASED_OUTPATIENT_CLINIC_OR_DEPARTMENT_OTHER): Payer: Self-pay

## 2023-07-19 ENCOUNTER — Ambulatory Visit (HOSPITAL_BASED_OUTPATIENT_CLINIC_OR_DEPARTMENT_OTHER): Admit: 2023-07-19 | Admitting: Orthopaedic Surgery

## 2023-07-19 HISTORY — DX: Atherosclerotic heart disease of native coronary artery without angina pectoris: I25.10

## 2023-07-19 HISTORY — DX: Chronic kidney disease, unspecified: N18.9

## 2023-07-19 SURGERY — CORRECTION, HAMMER TOE
Anesthesia: General | Site: Toe | Laterality: Right

## 2023-07-20 ENCOUNTER — Encounter: Payer: Self-pay | Admitting: Family Medicine

## 2023-07-20 ENCOUNTER — Ambulatory Visit: Admitting: Family Medicine

## 2023-07-20 VITALS — BP 138/58 | Ht 70.0 in | Wt 225.0 lb

## 2023-07-20 DIAGNOSIS — E119 Type 2 diabetes mellitus without complications: Secondary | ICD-10-CM

## 2023-07-20 DIAGNOSIS — M5416 Radiculopathy, lumbar region: Secondary | ICD-10-CM | POA: Diagnosis not present

## 2023-07-20 MED ORDER — TIZANIDINE HCL 4 MG PO TABS: 4.0000 mg | ORAL_TABLET | Freq: Every day | ORAL | 1 refills | Status: DC

## 2023-07-20 NOTE — Progress Notes (Signed)
 Subjective:   HPI: Patient is a 82 y.o. male here for radiating low back pain that began about a week ago. The pain is sharp, radiates laterally down the leg, and is worse in the morning as well as with transitions such as sitting and standing. He denies any numbness or tingling. He has a history of intermittent low back pain, described as sharp and sore, typically across the lower back. 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 He has a known history of arthritis. He reports mild relief with prednisone and Tylenol; however, a muscle relaxant provided no benefit. No recent imaging has been done.   Past Medical History:  Diagnosis Date   Arthritis    Benign localized prostatic hyperplasia with lower urinary tract symptoms (LUTS)    Chronic kidney disease    Coronary artery disease    Diverticulosis of colon    GERD (gastroesophageal reflux disease)    Hiatal hernia    Hypertension    followed by pcp  (11-01-2019  per pt had stress test done 10-28-2019 @ cardiology office of Dr Catha Gosselin, was told normal)   Intermittent palpitations    hx event monitor 2015 in care everywhere showed ST w/ frequent PVCs     Type 2 diabetes mellitus (HCC)    type 2    Wears hearing aid in both ears    per pt does not wear all the time    Current Outpatient Medications on File Prior to Visit  Medication Sig Dispense Refill   alfuzosin (UROXATRAL) 10 MG 24 hr tablet Take 10 mg by mouth at bedtime as needed (urinary flow issues).      amLODipine (NORVASC) 2.5 MG tablet Take 2.5 mg by mouth daily.     Ascorbic Acid (VITAMIN C PO) Take 1 tablet by mouth daily.     atorvastatin (LIPITOR) 20 MG tablet Take 20 mg by mouth daily.     Cholecalciferol (VITAMIN D3 PO) Take 1 capsule by mouth daily.      clopidogrel (PLAVIX) 75 MG tablet Take 75 mg by mouth daily.     colchicine 0.6 MG tablet Take 1.2mg  (2 tablets) then 0.6mg  (1 tablet) 1 hour after. Then, take 1 tablet every day  for 7 days. 10 tablet 2   Cyanocobalamin (B-12 PO) Take 1 lozenge by mouth daily as needed (energy).      cyclobenzaprine (FLEXERIL) 10 MG tablet Take 1 tablet (10 mg total) by mouth 3 (three) times daily as needed for muscle spasms. 30 tablet 1   dicyclomine (BENTYL) 20 MG tablet Take 20 mg by mouth every 6 (six) hours as needed.     diphenoxylate-atropine (LOMOTIL) 2.5-0.025 MG tablet Take by mouth.     doxycycline (VIBRAMYCIN) 100 MG capsule Take 1 capsule (100 mg total) by mouth 2 (two) times daily. 20 capsule 0   FARXIGA 10 MG TABS tablet Take 10 mg by mouth daily.     finasteride (PROSCAR) 5 MG tablet Take 5 mg by mouth daily.     glipiZIDE (GLUCOTROL) 5 MG tablet Take 5 mg by mouth daily before breakfast.     hydrochlorothiazide (HYDRODIURIL) 12.5 MG tablet Take 12.5 mg by mouth daily.      Lancets (ONETOUCH ULTRASOFT) lancets TEST TWICE DAILY. DX E11.65     metFORMIN (GLUCOPHAGE) 500 MG tablet Take by mouth.     metFORMIN (GLUCOPHAGE-XR) 500 MG 24 hr tablet Take 1,000 mg by mouth in the morning and at bedtime.  0   Misc Natural Products (TURMERIC CURCUMIN) CAPS Take 1 capsule by mouth daily.     Multiple Vitamin (MULTIVITAMIN) tablet Take 1 tablet by mouth daily.     Multiple Vitamins-Minerals (ZINC PO) Take 1 tablet by mouth daily.     naproxen sodium (ALEVE) 220 MG tablet Take 220 mg by mouth daily as needed (pain).     ondansetron (ZOFRAN) 4 MG tablet Take 1 tablet (4 mg total) by mouth every 8 (eight) hours as needed for nausea or vomiting. 10 tablet 0   ONETOUCH ULTRA test strip USE TO TEST GLUCOSE TWICE A DAY DX E11.65     oxybutynin (DITROPAN-XL) 5 MG 24 hr tablet Take 5 mg by mouth daily.     oxyCODONE-acetaminophen (PERCOCET) 5-325 MG tablet Take 1 tablet by mouth every 4 (four) hours as needed (max 6 q). 20 tablet 0   pantoprazole (PROTONIX) 20 MG tablet Take 20 mg by mouth daily.     simvastatin (ZOCOR) 20 MG tablet Take 20 mg by mouth at bedtime.      tamsulosin (FLOMAX)  0.4 MG CAPS capsule Take 0.4 mg by mouth daily.     valsartan (DIOVAN) 160 MG tablet Take 160 mg by mouth daily.     No current facility-administered medications on file prior to visit.    Past Surgical History:  Procedure Laterality Date   bilateral toe surgery     bunioinectomy and other procedures    CATARACT EXTRACTION W/ INTRAOCULAR LENS  IMPLANT, BILATERAL  2012  approx.   CYSTOSCOPY WITH INSERTION OF UROLIFT  2019   CYSTOSCOPY WITH INSERTION OF UROLIFT N/A 11/08/2019   Procedure: CYSTOSCOPY WITH INSERTION OF UROLIFT;  Surgeon: Jerilee Field, MD;  Location: [facility];  Service: Urology;  Laterality: N/A;   HEMICOLECTOMY  1990s   and appendectomy for diverticulitis   REVERSE SHOULDER ARTHROPLASTY Left 01/23/2020   Procedure: REVERSE SHOULDER ARTHROPLASTY;  Surgeon: Francena Hanly, MD;  Location: [facility];  Service: Orthopedics;  Laterality: Left;    TONSILLECTOMY  child   TOTAL KNEE ARTHROPLASTY Left 02-18-2014  [place]  [facility]    Allergies  Allergen Reactions   Bee Venom Hives and Anaphylaxis   Ciprofloxacin Other (See Comments) and Shortness Of Breath    "light headed"   Clindamycin Other (See Comments) and Nausea And Vomiting    Other reaction(s): GI Upset (intolerance)   Penicillins Hives and Rash   Azithromycin Other (See Comments)   Sulfa Antibiotics Other (See Comments)    "light headed"   Tamsulosin Other (See Comments)    dizziness, nausea, and raising blood sugar level     BP (!) 138/58   Ht 5\' 10"  (1.778 m)   Wt 225 lb (102.1 kg)   BMI 32.28 kg/m       No data to display              No data to display              Objective:  Physical Exam:  Gen: NAD, comfortable in exam room  MSK-   Low back - preserved forward flexion; however, extension is limited due to discomfort. The patient is able to stand on tiptoes without difficulty. Inspection of the right lower extremity shows no bruising or swelling. There is  tenderness to palpation over the lower lumbar region. Right leg exhibits full range of motion with 5/5 strength throughout. Deep tendon reflexes are 2+ and symmetric.   Neuro: no sensory or  motor deficits    Assessment & Plan:  83 year old male presenting with acute-on-chronic low back pain with features consistent with right-sided L5 radiculopathy. Pain is sharp, radiates laterally down the leg without associated numbness or weakness, and is exacerbated by movement, particularly transitions and morning stiffness. Physical exam shows tenderness in the lower lumbar spine, limited extension, preserved strength, and intact sensation, which supports a non-emergent radicular process without neurologic compromise. History of arthritis and ongoing manual labor likely contribute to mechanical stress and symptom recurrence.  Plan:  - Start physical therapy for L5 radiculopathy - Try Voltaren gel for lower back pain as needed - Consider heat therapy especially in the morning  - Stay active with light movements, but avoid heavy lifting and prolonged bending - Follow up in 4-6 weeks

## 2023-07-24 ENCOUNTER — Ambulatory Visit: Admitting: Podiatry

## 2023-07-26 ENCOUNTER — Encounter: Payer: Self-pay | Admitting: Podiatry

## 2023-07-26 ENCOUNTER — Ambulatory Visit (INDEPENDENT_AMBULATORY_CARE_PROVIDER_SITE_OTHER): Admitting: Podiatry

## 2023-07-26 DIAGNOSIS — L84 Corns and callosities: Secondary | ICD-10-CM

## 2023-07-26 DIAGNOSIS — E119 Type 2 diabetes mellitus without complications: Secondary | ICD-10-CM

## 2023-07-26 DIAGNOSIS — L989 Disorder of the skin and subcutaneous tissue, unspecified: Secondary | ICD-10-CM

## 2023-07-26 NOTE — Progress Notes (Signed)
 This patient returns to the office for painful corn on the third toe right foot.  Patient was scheduled to be seen by Dr.  Lydia Sams for tendon tenotomy.  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  He comes in the office today for treatment of painful corn third toe right foot.  Vascular  Dorsalis pedis and posterior tibial pulses are palpable  B/L.  Capillary return  WNL.  Temperature gradient is  WNL.  Skin turgor  WNL  Sensorium  Senn Weinstein monofilament wire  WNL. Normal tactile sensation.  Nail Exam  Patient has normal nails with evidence of bacterial or fungal infection.  Orthopedic  Exam  Muscle tone and muscle strength  WNL.  No limitations of motion feet  B/L.  No crepitus or joint effusion noted.  Foot type is unremarkable and digits show no abnormalities.  Bony prominences are unremarkable.  Skin  No open lesions.  Normal skin texture and turgor. Distal clavi third toe right foot.  Clavi third toe right foot  ROV.  Discussed and treated clavi with this patient.  RTC 10 weeks   Ruffin Cotton DPM

## 2023-08-07 ENCOUNTER — Ambulatory Visit: Payer: Medicare Other | Admitting: Podiatry

## 2023-10-03 ENCOUNTER — Ambulatory Visit: Admitting: Podiatry

## 2023-10-04 ENCOUNTER — Ambulatory Visit: Admitting: Podiatry

## 2023-10-25 ENCOUNTER — Encounter: Payer: Self-pay | Admitting: Podiatry

## 2023-10-25 ENCOUNTER — Ambulatory Visit (INDEPENDENT_AMBULATORY_CARE_PROVIDER_SITE_OTHER): Admitting: Podiatry

## 2023-10-25 DIAGNOSIS — M79674 Pain in right toe(s): Secondary | ICD-10-CM

## 2023-10-25 DIAGNOSIS — M79675 Pain in left toe(s): Secondary | ICD-10-CM

## 2023-10-25 DIAGNOSIS — L84 Corns and callosities: Secondary | ICD-10-CM

## 2023-10-25 DIAGNOSIS — E119 Type 2 diabetes mellitus without complications: Secondary | ICD-10-CM

## 2023-10-25 NOTE — Progress Notes (Signed)
This patient returns to my office for at risk foot care.  This patient requires this care by a professional since this patient will be at risk due to having diabetes.  This patient is unable to cut nails himself since the patient cannot reach his nails.These nails are painful walking and wearing shoes. He also has painful corn on the tip of his third toe right foot. The patient presents with his wife. This patient presents for at risk foot care today.  General Appearance  Alert, conversant and in no acute stress.  Vascular  Dorsalis pedis and posterior tibial  pulses are palpable  bilaterally.  Capillary return is within normal limits  bilaterally. Temperature is within normal limits  bilaterally.  Neurologic  Senn-Weinstein monofilament wire test within normal limits  bilaterally. Muscle power within normal limits bilaterally.  Nails Thick disfigured discolored nails with subungual debris  from hallux to fifth toes bilaterally. No evidence of bacterial infection or drainage bilaterally.  Orthopedic  No limitations of motion  feet .  No crepitus or effusions noted.  Hammer toe  B/L.  Skin  normotropic skin with no porokeratosis noted bilaterally.  No signs of infections or ulcers noted.   Distal clavi third toe right foot.  Onychomycosis  Pain in right toes  Pain in left toes  Clavi secondary hammer toe third right.  Consent was obtained for treatment procedures.   Mechanical debridement of nails 1-5  bilaterally performed with a nail nipper.  Filed with dremel without incident.    Return office visit    3 months                 Told patient to return for periodic foot care and evaluation due to potential at risk complications. Padding was dispensed.   Helane Gunther DPM  .

## 2024-01-04 ENCOUNTER — Encounter: Payer: Self-pay | Admitting: Sports Medicine

## 2024-01-04 ENCOUNTER — Ambulatory Visit: Admitting: Sports Medicine

## 2024-01-04 ENCOUNTER — Ambulatory Visit (INDEPENDENT_AMBULATORY_CARE_PROVIDER_SITE_OTHER): Admitting: Sports Medicine

## 2024-01-04 VITALS — BP 132/80 | Ht 70.0 in | Wt 225.0 lb

## 2024-01-04 DIAGNOSIS — M5416 Radiculopathy, lumbar region: Secondary | ICD-10-CM

## 2024-01-04 MED ORDER — METHYLPREDNISOLONE ACETATE 40 MG/ML IJ SUSP
40.0000 mg | Freq: Once | INTRAMUSCULAR | Status: AC
Start: 2024-01-04 — End: 2024-01-04
  Administered 2024-01-04: 40 mg via INTRAMUSCULAR

## 2024-01-04 MED ORDER — TIZANIDINE HCL 4 MG PO TABS
4.0000 mg | ORAL_TABLET | Freq: Every day | ORAL | 0 refills | Status: AC
Start: 2024-01-04 — End: ?

## 2024-01-04 NOTE — Progress Notes (Signed)
   Subjective:    Patient ID: Gabriel Mcpherson, male    DOB: [birth year], 83 y.o.   MRN: [id number]  HPI chief complaint: Low back pain  Patient is a very pleasant 83 year old male that presents today with acute on chronic low back pain.  He was seen by Dr. Teressa in April.  Physical therapy was prescribed but he has not yet attended.  He was also prescribed some tizanidine  which was helpful.  His current pain is on the left side of his low back and will radiate down the left leg.  It is much worse at night.  He does well during the day and is able to be active with minimal discomfort.  No recent trauma.  No recent imaging.  He has had lumbar epidural steroid injections in the past but experienced extreme hyperglycemia postinjection due to his diabetes.  He is here today with his wife.    Review of Systems As above    Objective:   Physical Exam  Well-developed, well-nourished.  No acute distress  Lumbar spine: Good lumbar range of motion.  A little discomfort with full flexion.  No pain with extension.  No tenderness to palpation along the midline.  No obvious spasm.  No gross neurological deficit of either lower extremity.      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  I did caution about transient increase in blood glucose with this.  I think he would benefit greatly from physical therapy.  He lives in [place], [place]  and has had some physical therapy after having a cardiac stent and pacemaker placed.  He and his wife will check with that facility to see if they also do musculoskeletal PT.  If so, they will provide us  with their office information so that we may put in the appropriate referral.  Follow-up for ongoing or recalcitrant issues.  This note was dictated using Dragon naturally speaking software and may contain errors in syntax, spelling, or content which have not been identified prior to signing this note.

## 2024-01-05 ENCOUNTER — Ambulatory Visit: Admitting: Podiatry

## 2024-01-08 ENCOUNTER — Ambulatory Visit: Admitting: Family Medicine

## 2024-01-09 ENCOUNTER — Telehealth: Payer: Self-pay | Admitting: *Deleted

## 2024-01-09 DIAGNOSIS — M5416 Radiculopathy, lumbar region: Secondary | ICD-10-CM

## 2024-01-09 NOTE — Telephone Encounter (Signed)
-----   Message from Alisa DEL sent at 01/09/2024  2:35 PM EDT ----- Regarding: Pt's wife cb w/ [facility] referral info Ms.Riese says they were told to call back w/ a Physical therapy office in [place] where they live.  The Health system up there is [facility] & they hv a PT dept offsite @  [facility] [address] [place], [place] [address] P) [phone] F) [phone]  Please fax them the PT referral for patient.& make sure to mention he has : Pacemaker & Stent !  Thx

## 2024-01-09 NOTE — Telephone Encounter (Signed)
 PT referral sent to fax number below.

## 2024-01-19 ENCOUNTER — Ambulatory Visit: Admitting: Podiatry

## 2024-01-19 ENCOUNTER — Ambulatory Visit

## 2024-01-19 DIAGNOSIS — M2041 Other hammer toe(s) (acquired), right foot: Secondary | ICD-10-CM

## 2024-01-19 DIAGNOSIS — M2042 Other hammer toe(s) (acquired), left foot: Secondary | ICD-10-CM

## 2024-01-19 DIAGNOSIS — Z01818 Encounter for other preprocedural examination: Secondary | ICD-10-CM | POA: Diagnosis not present

## 2024-01-19 NOTE — Progress Notes (Signed)
 Subjective:  Patient ID: Gabriel Mcpherson, male    DOB: [birth year],  MRN: [id number]  Chief Complaint  Patient presents with   Toe Pain    Bilateral middle toe pain     83 y.o. male presents with the above complaint.  Patient presents for bilateral third digit hammertoe contracture hurts with ambulation or shoe pressure he wanted get it evaluated has not seen and was prior to seeing me denies any other acute complaints he is being treated by Dr. Loreda for quite some time has failed all conservative care including shoe gear modification padding protecting offloading wishes to undergo surgical intervention.  Denies any other acute issues.  He is a type II diabetic with controlled A1c   Review of Systems: Negative except as noted in the HPI. Denies N/V/F/Ch.  Past Medical History:  Diagnosis Date   Arthritis    Benign localized prostatic hyperplasia with lower urinary tract symptoms (LUTS)    Chronic kidney disease    Coronary artery disease    Diverticulosis of colon    GERD (gastroesophageal reflux disease)    Hiatal hernia    Hypertension    followed by pcp  (11-01-2019  per pt had stress test done 10-28-2019 @ cardiology office of Dr Rosalba, was told normal)   Intermittent palpitations    hx event monitor 2015 in care everywhere showed ST w/ frequent PVCs     Type 2 diabetes mellitus (HCC)    type 2    Wears hearing aid in both ears    per pt does not wear all the time    Current Outpatient Medications:    alfuzosin  (UROXATRAL ) 10 MG 24 hr tablet, Take 10 mg by mouth at bedtime as needed (urinary flow issues). , Disp: , Rfl:    amLODipine (NORVASC) 2.5 MG tablet, Take 2.5 mg by mouth daily., Disp: , Rfl:    Ascorbic Acid (VITAMIN C PO), Take 1 tablet by mouth daily., Disp: , Rfl:    atorvastatin (LIPITOR) 20 MG tablet, Take 20 mg by mouth daily., Disp: , Rfl:    Cholecalciferol (VITAMIN D3 PO), Take 1 capsule by mouth daily. , Disp: , Rfl:    clopidogrel (PLAVIX) 75 MG  tablet, Take 75 mg by mouth daily., Disp: , Rfl:    colchicine  0.6 MG tablet, Take 1.2mg  (2 tablets) then 0.6mg  (1 tablet) 1 hour after. Then, take 1 tablet every day for 7 days., Disp: 10 tablet, Rfl: 2   Cyanocobalamin (B-12 PO), Take 1 lozenge by mouth daily as needed (energy). , Disp: , Rfl:    dicyclomine (BENTYL) 20 MG tablet, Take 20 mg by mouth every 6 (six) hours as needed., Disp: , Rfl:    diphenoxylate-atropine (LOMOTIL) 2.5-0.025 MG tablet, Take by mouth., Disp: , Rfl:    doxycycline  (VIBRAMYCIN ) 100 MG capsule, Take 1 capsule (100 mg total) by mouth 2 (two) times daily., Disp: 20 capsule, Rfl: 0   FARXIGA 10 MG TABS tablet, Take 10 mg by mouth daily., Disp: , Rfl:    finasteride (PROSCAR) 5 MG tablet, Take 5 mg by mouth daily., Disp: , Rfl:    glipiZIDE (GLUCOTROL) 5 MG tablet, Take 5 mg by mouth daily before breakfast., Disp: , Rfl:    hydrochlorothiazide  (HYDRODIURIL ) 12.5 MG tablet, Take 12.5 mg by mouth daily. , Disp: , Rfl:    Lancets (ONETOUCH ULTRASOFT) lancets, TEST TWICE DAILY. DX E11.65, Disp: , Rfl:    metFORMIN (GLUCOPHAGE) 500 MG tablet, Take by mouth., Disp: ,  Rfl:    metFORMIN (GLUCOPHAGE-XR) 500 MG 24 hr tablet, Take 1,000 mg by mouth in the morning and at bedtime. , Disp: , Rfl: 0   Misc Natural Products (TURMERIC CURCUMIN) CAPS, Take 1 capsule by mouth daily., Disp: , Rfl:    Multiple Vitamin (MULTIVITAMIN) tablet, Take 1 tablet by mouth daily., Disp: , Rfl:    Multiple Vitamins-Minerals (ZINC PO), Take 1 tablet by mouth daily., Disp: , Rfl:    naproxen sodium (ALEVE) 220 MG tablet, Take 220 mg by mouth daily as needed (pain)., Disp: , Rfl:    ondansetron  (ZOFRAN ) 4 MG tablet, Take 1 tablet (4 mg total) by mouth every 8 (eight) hours as needed for nausea or vomiting., Disp: 10 tablet, Rfl: 0   ONETOUCH ULTRA test strip, USE TO TEST GLUCOSE TWICE A DAY DX E11.65, Disp: , Rfl:    oxybutynin (DITROPAN-XL) 5 MG 24 hr tablet, Take 5 mg by mouth daily., Disp: , Rfl:     oxyCODONE -acetaminophen  (PERCOCET) 5-325 MG tablet, Take 1 tablet by mouth every 4 (four) hours as needed (max 6 q)., Disp: 20 tablet, Rfl: 0   pantoprazole  (PROTONIX ) 20 MG tablet, Take 20 mg by mouth daily., Disp: , Rfl:    simvastatin (ZOCOR) 20 MG tablet, Take 20 mg by mouth at bedtime. , Disp: , Rfl:    tamsulosin (FLOMAX) 0.4 MG CAPS capsule, Take 0.4 mg by mouth daily., Disp: , Rfl:    tiZANidine  (ZANAFLEX ) 4 MG tablet, Take 1 tablet (4 mg total) by mouth at bedtime., Disp: 30 tablet, Rfl: 0   valsartan (DIOVAN) 160 MG tablet, Take 160 mg by mouth daily., Disp: , Rfl:   Social History   Tobacco Use  Smoking Status Never  Smokeless Tobacco Never    Allergies  Allergen Reactions   Bee Venom Hives and Anaphylaxis   Ciprofloxacin Other (See Comments) and Shortness Of Breath    light headed   Clindamycin Other (See Comments) and Nausea And Vomiting    Other reaction(s): GI Upset (intolerance)   Penicillins Hives and Rash   Azithromycin Other (See Comments)   Sulfa Antibiotics Other (See Comments)    light headed   Tamsulosin Other (See Comments)    dizziness, nausea, and raising blood sugar level    Objective:  There were no vitals filed for this visit. There is no height or weight on file to calculate BMI. Constitutional Well developed. Well nourished.  Vascular Dorsalis pedis pulses palpable bilaterally. Posterior tibial pulses palpable bilaterally. Capillary refill normal to all digits.  No cyanosis or clubbing noted. Pedal hair growth normal.  Neurologic Normal speech. Oriented to person, place, and time. Epicritic sensation to light touch grossly present bilaterally.  Dermatologic Nails well groomed and normal in appearance. No open wounds. No skin lesions.  Orthopedic: Bilateral third digit hammertoe contracture semiflexible in nature with underlying capsulotomy of the third metatarsophalangeal joint.  Pain on palpation no open wounds or lesions    Radiographs: 3 views of Dr. Silva bilateral foot: Hammertoe contracture of the third digit noted bilaterally slight adductovarus deformity mild midfoot arthritis Assessment:   1. Hammertoe of right foot   2. Hammertoe of left foot   3. Encounter for preoperative examination for general surgical procedure    Plan:  Patient was evaluated and treated and all questions answered.  Bilateral third digit hammertoe deofmirt  - All questions and concerns were discussed with the patient in extensive detail given the amount of pain that he is having in  the setting of failed conservative care listed above he will benefit from surgical intervention I discussed my arthroplasty of third digit bilateral with possible capsulotomy of the metatarsophalangeal joint I discussed this with the patient extensive detail he states understanding would like to proceed with surgery patient will need K wire fixation as well.  He agrees with the plan -Informed surgical risk consent was reviewed and read aloud to the patient.  I reviewed the films.  I have discussed my findings with the patient in great detail.  I have discussed all risks including but not limited to infection, stiffness, scarring, limp, disability, deformity, damage to blood vessels and nerves, numbness, poor healing, need for braces, arthritis, chronic pain, amputation, death.  All benefits and realistic expectations discussed in great detail.  I have made no promises as to the outcome.  I have provided realistic expectations.  I have offered the patient a 2nd opinion, which they have declined and assured me they preferred to proceed despite the risks   No follow-ups on file.

## 2024-01-26 ENCOUNTER — Telehealth: Payer: Self-pay | Admitting: Podiatry

## 2024-01-26 NOTE — Telephone Encounter (Signed)
 Called and patient is scheduled for surgery on 11/24 per their request. Patient is on blood thinners and advised to d/c use 7 days prior. Will Send H&P form closer to sx date and cardiac clearance. Patient preferred pharmacy is set in the chart.

## 2024-01-29 ENCOUNTER — Encounter: Payer: Self-pay | Admitting: Podiatry

## 2024-01-29 ENCOUNTER — Ambulatory Visit (INDEPENDENT_AMBULATORY_CARE_PROVIDER_SITE_OTHER): Admitting: Podiatry

## 2024-01-29 DIAGNOSIS — M79674 Pain in right toe(s): Secondary | ICD-10-CM

## 2024-01-29 DIAGNOSIS — M79675 Pain in left toe(s): Secondary | ICD-10-CM

## 2024-01-29 DIAGNOSIS — E119 Type 2 diabetes mellitus without complications: Secondary | ICD-10-CM | POA: Diagnosis not present

## 2024-01-29 NOTE — Progress Notes (Signed)
This patient returns to my office for at risk foot care.  This patient requires this care by a professional since this patient will be at risk due to having diabetes.  This patient is unable to cut nails himself since the patient cannot reach his nails.These nails are painful walking and wearing shoes. He also has painful corn on the tip of his third toe right foot. The patient presents with his wife. This patient presents for at risk foot care today.  General Appearance  Alert, conversant and in no acute stress.  Vascular  Dorsalis pedis and posterior tibial  pulses are palpable  bilaterally.  Capillary return is within normal limits  bilaterally. Temperature is within normal limits  bilaterally.  Neurologic  Senn-Weinstein monofilament wire test within normal limits  bilaterally. Muscle power within normal limits bilaterally.  Nails Thick disfigured discolored nails with subungual debris  from hallux to fifth toes bilaterally. No evidence of bacterial infection or drainage bilaterally.  Orthopedic  No limitations of motion  feet .  No crepitus or effusions noted.  Hammer toe  B/L.  Skin  normotropic skin with no porokeratosis noted bilaterally.  No signs of infections or ulcers noted.   Distal clavi third toe right foot.  Onychomycosis  Pain in right toes  Pain in left toes  Clavi secondary hammer toe third right.  Consent was obtained for treatment procedures.   Mechanical debridement of nails 1-5  bilaterally performed with a nail nipper.  Filed with dremel without incident.    Return office visit    3 months                 Told patient to return for periodic foot care and evaluation due to potential at risk complications. Padding was dispensed.   Helane Gunther DPM  .

## 2024-01-30 ENCOUNTER — Other Ambulatory Visit: Payer: Self-pay | Admitting: Sports Medicine

## 2024-01-30 DIAGNOSIS — M5416 Radiculopathy, lumbar region: Secondary | ICD-10-CM

## 2024-01-31 ENCOUNTER — Ambulatory Visit: Admitting: Podiatry

## 2024-02-15 ENCOUNTER — Ambulatory Visit: Admitting: Family Medicine

## 2024-02-15 ENCOUNTER — Encounter: Payer: Self-pay | Admitting: Family Medicine

## 2024-02-15 VITALS — BP 157/64 | Ht 70.0 in | Wt 220.0 lb

## 2024-02-15 DIAGNOSIS — M2042 Other hammer toe(s) (acquired), left foot: Secondary | ICD-10-CM

## 2024-02-15 DIAGNOSIS — E119 Type 2 diabetes mellitus without complications: Secondary | ICD-10-CM

## 2024-02-15 DIAGNOSIS — M2041 Other hammer toe(s) (acquired), right foot: Secondary | ICD-10-CM

## 2024-02-15 DIAGNOSIS — Z8631 Personal history of diabetic foot ulcer: Secondary | ICD-10-CM | POA: Insufficient documentation

## 2024-02-15 DIAGNOSIS — Z950 Presence of cardiac pacemaker: Secondary | ICD-10-CM | POA: Diagnosis not present

## 2024-02-15 NOTE — Progress Notes (Signed)
 DATE OF VISIT: 02/15/2024        REVAN Mcpherson DOB: 31-May-1940 MRN: [id number]  Discussed the use of AI scribe software for clinical note transcription with the patient, who gave verbal consent to proceed.  History of Present Illness Gabriel Mcpherson is an 83 year old male with diabetes who presents with foot pain and is seeking a second opinion on surgical intervention for hammer toe. Previously seen by us  for lumbar radiculopathy  Foot pain and digital deformity - Chronic foot pain in both feet - Pain more localized to the right 3rd toe due to hammer toe deformity - Pain persists despite use of non-surgical interventions including: cortisone injection, shoe inserts, cushions, and shoes with a wide toe box - Pain is significant and impacts daily comfort - Previously seen by podiatry Dr. Franky Blanch 01/19/2024 who recommended bilateral third toe hammertoe surgery.  Patient is tentatively scheduled for this procedure 03/04/2024, but was curious on additional opinions - Also follows with podiatry Dr. Cordella Bold chronic footcare related to his diabetes --He has persistent callus on the tips of his toes, as well as history of ulcerations from where his toes are rubbing - Denies any numbness or tingling in his feet.  Prior interventions and complications - Received injections in the past for foot pain, which were ineffective - Injections resulted in significant hyperglycemia, with blood glucose rising up to 400 mg/dL   Diabetes mellitus - Diabetes mellitus with history of significant hyperglycemic response to corticosteroid injections - History of diabetic ulcerations of his toes - Regular podiatric care for calluses and toenails as noted above  Cardiac device - Pacemaker in place    Medications:  Outpatient Encounter Medications as of 02/15/2024  Medication Sig   alfuzosin  (UROXATRAL ) 10 MG 24 hr tablet Take 10 mg by mouth at bedtime as needed (urinary flow issues).     amLODipine (NORVASC) 2.5 MG tablet Take 2.5 mg by mouth daily.   Ascorbic Acid (VITAMIN C PO) Take 1 tablet by mouth daily.   atorvastatin (LIPITOR) 20 MG tablet Take 20 mg by mouth daily.   Cholecalciferol (VITAMIN D3 PO) Take 1 capsule by mouth daily.    clopidogrel (PLAVIX) 75 MG tablet Take 75 mg by mouth daily.   colchicine  0.6 MG tablet Take 1.2mg  (2 tablets) then 0.6mg  (1 tablet) 1 hour after. Then, take 1 tablet every day for 7 days.   Cyanocobalamin (B-12 PO) Take 1 lozenge by mouth daily as needed (energy).    dicyclomine (BENTYL) 20 MG tablet Take 20 mg by mouth every 6 (six) hours as needed.   diphenoxylate-atropine (LOMOTIL) 2.5-0.025 MG tablet Take by mouth.   doxycycline  (VIBRAMYCIN ) 100 MG capsule Take 1 capsule (100 mg total) by mouth 2 (two) times daily.   FARXIGA 10 MG TABS tablet Take 10 mg by mouth daily.   finasteride (PROSCAR) 5 MG tablet Take 5 mg by mouth daily.   glipiZIDE (GLUCOTROL) 5 MG tablet Take 5 mg by mouth daily before breakfast.   hydrochlorothiazide  (HYDRODIURIL ) 12.5 MG tablet Take 12.5 mg by mouth daily.    Lancets (ONETOUCH ULTRASOFT) lancets TEST TWICE DAILY. DX E11.65   metFORMIN (GLUCOPHAGE) 500 MG tablet Take by mouth.   metFORMIN (GLUCOPHAGE-XR) 500 MG 24 hr tablet Take 1,000 mg by mouth in the morning and at bedtime.    Misc Natural Products (TURMERIC CURCUMIN) CAPS Take 1 capsule by mouth daily.   Multiple Vitamin (MULTIVITAMIN) tablet Take 1 tablet by mouth daily.  Multiple Vitamins-Minerals (ZINC PO) Take 1 tablet by mouth daily.   naproxen sodium (ALEVE) 220 MG tablet Take 220 mg by mouth daily as needed (pain).   ondansetron  (ZOFRAN ) 4 MG tablet Take 1 tablet (4 mg total) by mouth every 8 (eight) hours as needed for nausea or vomiting.   ONETOUCH ULTRA test strip USE TO TEST GLUCOSE TWICE A DAY DX E11.65   oxybutynin (DITROPAN-XL) 5 MG 24 hr tablet Take 5 mg by mouth daily.   oxyCODONE -acetaminophen  (PERCOCET) 5-325 MG tablet Take 1 tablet  by mouth every 4 (four) hours as needed (max 6 q).   pantoprazole  (PROTONIX ) 20 MG tablet Take 20 mg by mouth daily.   simvastatin (ZOCOR) 20 MG tablet Take 20 mg by mouth at bedtime.    tamsulosin (FLOMAX) 0.4 MG CAPS capsule Take 0.4 mg by mouth daily.   tiZANidine  (ZANAFLEX ) 4 MG tablet Take 1 tablet (4 mg total) by mouth at bedtime.   valsartan (DIOVAN) 160 MG tablet Take 160 mg by mouth daily.   No facility-administered encounter medications on file as of 02/15/2024.    Allergies: is allergic to bee venom, ciprofloxacin, clindamycin, penicillins, azithromycin, sulfa antibiotics, and tamsulosin.  Physical Examination: Vitals: BP (!) 157/64   Ht 5' 10 (1.778 m)   Wt 220 lb (99.8 kg)   BMI 31.57 kg/m  GENERAL:  Gabriel Mcpherson is a 83 y.o. male appearing their stated age, alert and oriented x 3, in no apparent distress.  MSK: Foot: Right foot with prominent hammertoe formation of the third toe.  Has prominent callus and healed ulceration on the tip of the third toe.  No increased redness, no bleeding or discharge.  No increased warmth. Left foot with prominent hammertoe deformity of the third toe as well.  Also has prominent callus and healed ulceration on the tip of the third toe.  No increased redness, no bleeding or discharge, no increased warmth Walking without a limp NEURO: sensation intact to light touch lower extremity bilaterally VASC: pulses 2+ and symmetric DP/PT bilaterally, 1+ lower extremity edema bilaterally Assessment & Plan Chronic bilateral foot pain with associated hammertoes, history of diabetic ulcerations on the tips of third toes bilaterally, likely related to underlying rubbing - Limited improvement with multiple conservative measures including changing footwear, shoe inserts, cortisone injections - Given his ongoing symptoms and history of ulcerations, likely would be candidate for surgery.  Referred to Dr. Harden for another surgical opinion. - Will keep current  procedure as scheduled on November 24th and cancel or change pending his evaluation by Dr. Harden  Type 2 diabetes mellitus with history of diabetic foot ulcerations - Complicates hammer toe management due to ulceration risk and delayed healing. Injections avoided due to blood glucose impact. - Referral to Dr. Harden as noted above  Pacemaker - Patient with pacemaker in place.  Will need to review surgical options with specialist in detail     Patient expressed understanding & agreement with above.  Encounter Diagnoses  Name Primary?   Hammer toes of both feet Yes   Type 2 diabetes mellitus without complication, without long-term current use of insulin (HCC)    History of diabetic ulcer of foot    Pacemaker     Orders Placed This Encounter  Procedures   Ambulatory referral to Orthopedic Surgery     Contains text generated by [employer].

## 2024-02-26 ENCOUNTER — Ambulatory Visit: Admitting: Orthopedic Surgery

## 2024-02-26 ENCOUNTER — Encounter: Payer: Self-pay | Admitting: Orthopedic Surgery

## 2024-02-26 DIAGNOSIS — M25571 Pain in right ankle and joints of right foot: Secondary | ICD-10-CM | POA: Diagnosis not present

## 2024-02-26 DIAGNOSIS — M6702 Short Achilles tendon (acquired), left ankle: Secondary | ICD-10-CM

## 2024-02-26 DIAGNOSIS — M6701 Short Achilles tendon (acquired), right ankle: Secondary | ICD-10-CM

## 2024-02-26 NOTE — Progress Notes (Signed)
 Office Visit Note   Patient: Gabriel Mcpherson           Date of Birth: October 01, 1940           MRN: [id number] Visit Date: 02/26/2024              Requested by: Teressa Rainell BROCKS, DO [address] [place],  [place] [address] PCP: Favero, John Patrick, DO  Chief Complaint  Patient presents with   Right Foot - Pain   Left Foot - Pain      HPI: Discussed the use of AI scribe software for clinical note transcription with the patient, who gave verbal consent to proceed.  History of Present Illness Gabriel Mcpherson is an 83 year old male who presents with foot pain and calluses.  He experiences persistent calluses on his toes, which worsen with certain footwear. He also reports loose nails and is concerned about pressure on his toes due to a tight Achilles tendon.  He has a history of bunion surgery on the great toe, with current joint space narrowing and osteophytic bone spurs in the great toe MTP joint. He also notes advanced arthritic changes in the right foot, particularly in the midfoot and at the base of the first metatarsal.  He has a pacemaker and recently completed therapy for back stiffness. He prefers to remain active and not be restricted for a prolonged period.     Assessment & Plan: Visit Diagnoses: No diagnosis found.  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 Right third toe ulcer secondary to Achilles contracture. No infection signs. Conservative management preferred due to pacemaker and recent back therapy. - Instructed on Achilles stretch three times a day for one minute each session. - Continue wearing stiff sole New Balance sneakers. - Use silicone sleeve for toe protection. - Consider Achilles lengthening surgery if conservative measures fail.  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 Long second and third metatarsals noted on radiographs.  Left great toe status post bunion surgery with osteoarthritis Previous bunion surgery with joint space narrowing and osteophytic bone spurs in the great toe MTP joint. Long second and third metatarsals noted on radiographs.      Follow-Up Instructions: No follow-ups on file.   Ortho Exam  Patient is alert, oriented, no adenopathy, well-dressed, normal affect, normal respiratory effort. Physical Exam CARDIOVASCULAR: Dorsal pedis pulse palpable bilaterally with knee extended. MUSCULOSKELETAL: Significant Achilles contracture bilaterally with dorsiflexion 20 degrees short of neutral. SKIN: Ulcer at the tip of the right third toe. No redness, cellulitis, or signs of infection of the toes.      Imaging: No results found. No images are attached to the encounter.  Labs: Lab Results  Component Value Date   HGBA1C 6.5 (H) 01/20/2020   LABURIC 5.9 06/09/2022     No results found for: ALBUMIN, PREALBUMIN, CBC  No results found for: MG No results found for: VD25OH  No results found for: PREALBUMIN    Latest Ref Rng & Units 06/09/2022   11:38 AM 01/20/2020   10:32 AM 11/08/2019    9:56 AM  CBC EXTENDED  WBC 3.4 - 10.8 x10E3/uL 17.8  8.3    RBC 4.14 - 5.80 x10E6/uL 4.40  4.67    Hemoglobin  13.0 - 17.7 g/dL 85.5  84.8  84.9   HCT 37.5 - 51.0 % 42.1  44.6  44.0   Platelets 150 - 450 x10E3/uL 220  186    NEUT# 1.4 - 7.0 x10E3/uL 15.0     Lymph# 0.7 - 3.1 x10E3/uL 1.7        There is no height or weight on file to calculate BMI.  Orders:  No orders of the defined types were placed in this encounter.  No orders of the defined types were placed in this encounter.    Procedures: No procedures performed  Clinical Data: No additional findings.  ROS:  All other systems  negative, except as noted in the HPI. Review of Systems  Objective: Vital Signs: There were no vitals taken for this visit.  Specialty Comments:  No specialty comments available.  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   Palpitations 12/19/2013   Past Medical History:  Diagnosis Date   Arthritis    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 No pertinent family history.  Past Surgical History:  Procedure Laterality Date   bilateral toe surgery     bunioinectomy and other procedures    CATARACT EXTRACTION W/ INTRAOCULAR LENS  IMPLANT, BILATERAL  2012  approx.   CYSTOSCOPY WITH INSERTION OF UROLIFT  2019   CYSTOSCOPY WITH INSERTION OF UROLIFT N/A 11/08/2019   Procedure: CYSTOSCOPY WITH INSERTION OF UROLIFT;  Surgeon: Nieves Cough, MD;  Location: [facility];  Service: Urology;  Laterality: N/A;   HEMICOLECTOMY  1990s    and appendectomy for diverticulitis   REVERSE SHOULDER ARTHROPLASTY Left 01/23/2020   Procedure: REVERSE SHOULDER ARTHROPLASTY;  Surgeon: Melita Drivers, MD;  Location: [facility];  Service: Orthopedics;  Laterality: Left;    TONSILLECTOMY  child   TOTAL KNEE ARTHROPLASTY Left 02-18-2014  [place]  [facility]   Social History   Occupational History   Not on file  Tobacco Use   Smoking status: Never   Smokeless tobacco: Never  Vaping Use   Vaping status: Never Used  Substance and Sexual Activity   Alcohol use: Never   Drug use: Never   Sexual activity: Not on file

## 2024-04-18 ENCOUNTER — Ambulatory Visit: Admitting: Orthopedic Surgery

## 2024-04-29 ENCOUNTER — Ambulatory Visit: Admitting: Podiatry

## 2024-04-30 ENCOUNTER — Ambulatory Visit: Admitting: Orthopedic Surgery

## 2024-06-03 ENCOUNTER — Ambulatory Visit: Admitting: Orthopedic Surgery
